# Patient Record
Sex: Male | Born: 1960 | Race: White | Hispanic: No | Marital: Single | Smoking: Never smoker
Health system: Southern US, Community
[De-identification: ages and names within clinical notes are randomized; demographics above are authoritative.]

## PROBLEM LIST (undated history)

## (undated) DIAGNOSIS — G4700 Insomnia, unspecified: Secondary | ICD-10-CM

## (undated) DIAGNOSIS — Z23 Encounter for immunization: Secondary | ICD-10-CM

## (undated) DIAGNOSIS — F32A Depression, unspecified: Secondary | ICD-10-CM

## (undated) DIAGNOSIS — I10 Essential (primary) hypertension: Secondary | ICD-10-CM

## (undated) DIAGNOSIS — K6289 Other specified diseases of anus and rectum: Secondary | ICD-10-CM

## (undated) DIAGNOSIS — F329 Major depressive disorder, single episode, unspecified: Secondary | ICD-10-CM

## (undated) DIAGNOSIS — K529 Noninfective gastroenteritis and colitis, unspecified: Secondary | ICD-10-CM

## (undated) DIAGNOSIS — F529 Unspecified sexual dysfunction not due to a substance or known physiological condition: Secondary | ICD-10-CM

## (undated) DIAGNOSIS — F332 Major depressive disorder, recurrent severe without psychotic features: Secondary | ICD-10-CM

## (undated) DIAGNOSIS — N62 Hypertrophy of breast: Secondary | ICD-10-CM

## (undated) DIAGNOSIS — M79604 Pain in right leg: Secondary | ICD-10-CM

## (undated) DIAGNOSIS — M79605 Pain in left leg: Principal | ICD-10-CM

## (undated) DIAGNOSIS — M25519 Pain in unspecified shoulder: Secondary | ICD-10-CM

## (undated) DIAGNOSIS — B20 Human immunodeficiency virus [HIV] disease: Secondary | ICD-10-CM

## (undated) DIAGNOSIS — R4586 Emotional lability: Secondary | ICD-10-CM

## (undated) HISTORY — DX: Major depressive disorder, recurrent severe without psychotic features: F33.2

## (undated) HISTORY — DX: Essential (primary) hypertension: I10

## (undated) HISTORY — DX: Depression, unspecified: F32.A

## (undated) HISTORY — DX: Other specified diseases of anus and rectum: K62.89

## (undated) HISTORY — DX: Pain in unspecified shoulder: M25.519

## (undated) HISTORY — DX: Pain in right leg: M79.604

## (undated) HISTORY — DX: Hypertrophy of breast: N62

## (undated) HISTORY — DX: Unspecified sexual dysfunction not due to a substance or known physiological condition: F52.9

## (undated) HISTORY — DX: Pain in left leg: M79.605

## (undated) HISTORY — DX: Noninfective gastroenteritis and colitis, unspecified: K52.9

## (undated) HISTORY — DX: Insomnia, unspecified: G47.00

## (undated) HISTORY — PX: TONSILLECTOMY: SUR1361

## (undated) HISTORY — PX: SKIN CANCER EXCISION: SHX779

## (undated) HISTORY — DX: Major depressive disorder, single episode, unspecified: F32.9

## (undated) HISTORY — DX: Emotional lability: R45.86

## (undated) HISTORY — DX: Encounter for immunization: Z23

---

## 2000-06-29 ENCOUNTER — Encounter: Admission: RE | Admit: 2000-06-29 | Discharge: 2000-09-27 | Payer: Self-pay | Admitting: Family Medicine

## 2006-01-08 ENCOUNTER — Emergency Department (HOSPITAL_COMMUNITY): Admission: EM | Admit: 2006-01-08 | Discharge: 2006-01-08 | Payer: Self-pay | Admitting: Family Medicine

## 2006-02-01 ENCOUNTER — Encounter (INDEPENDENT_AMBULATORY_CARE_PROVIDER_SITE_OTHER): Payer: Self-pay | Admitting: *Deleted

## 2006-02-01 ENCOUNTER — Encounter: Admission: RE | Admit: 2006-02-01 | Discharge: 2006-02-01 | Payer: Self-pay | Admitting: Internal Medicine

## 2006-02-01 ENCOUNTER — Ambulatory Visit: Payer: Self-pay | Admitting: Internal Medicine

## 2006-02-01 LAB — CONVERTED CEMR LAB: CD4 T Cell Abs: 550

## 2006-02-19 ENCOUNTER — Ambulatory Visit: Payer: Self-pay | Admitting: Internal Medicine

## 2006-02-21 ENCOUNTER — Emergency Department (HOSPITAL_COMMUNITY): Admission: EM | Admit: 2006-02-21 | Discharge: 2006-02-21 | Payer: Self-pay | Admitting: Family Medicine

## 2006-04-19 ENCOUNTER — Encounter (INDEPENDENT_AMBULATORY_CARE_PROVIDER_SITE_OTHER): Payer: Self-pay | Admitting: *Deleted

## 2006-04-19 LAB — CONVERTED CEMR LAB
CD4 Count: 0 microliters
HIV 1 RNA Quant: 0 copies/mL

## 2006-04-28 ENCOUNTER — Ambulatory Visit (HOSPITAL_COMMUNITY): Admission: RE | Admit: 2006-04-28 | Discharge: 2006-04-28 | Payer: Self-pay | Admitting: Urology

## 2006-05-02 ENCOUNTER — Encounter (INDEPENDENT_AMBULATORY_CARE_PROVIDER_SITE_OTHER): Payer: Self-pay | Admitting: *Deleted

## 2006-05-25 ENCOUNTER — Ambulatory Visit (HOSPITAL_BASED_OUTPATIENT_CLINIC_OR_DEPARTMENT_OTHER): Admission: RE | Admit: 2006-05-25 | Discharge: 2006-05-25 | Payer: Self-pay | Admitting: Urology

## 2007-05-19 IMAGING — CR DG ORBITS FOR FOREIGN BODY
2 series · 2 of 2 positions shown · non-contrast
Comparison: none

CLINICAL DATA: Pre MRI.  History of metal exposure to the eyes.  The patient is a welder.  
 ORBITS FOR FOREIGN BODY - 1 VIEW:

[view not recorded (1 of 2)]
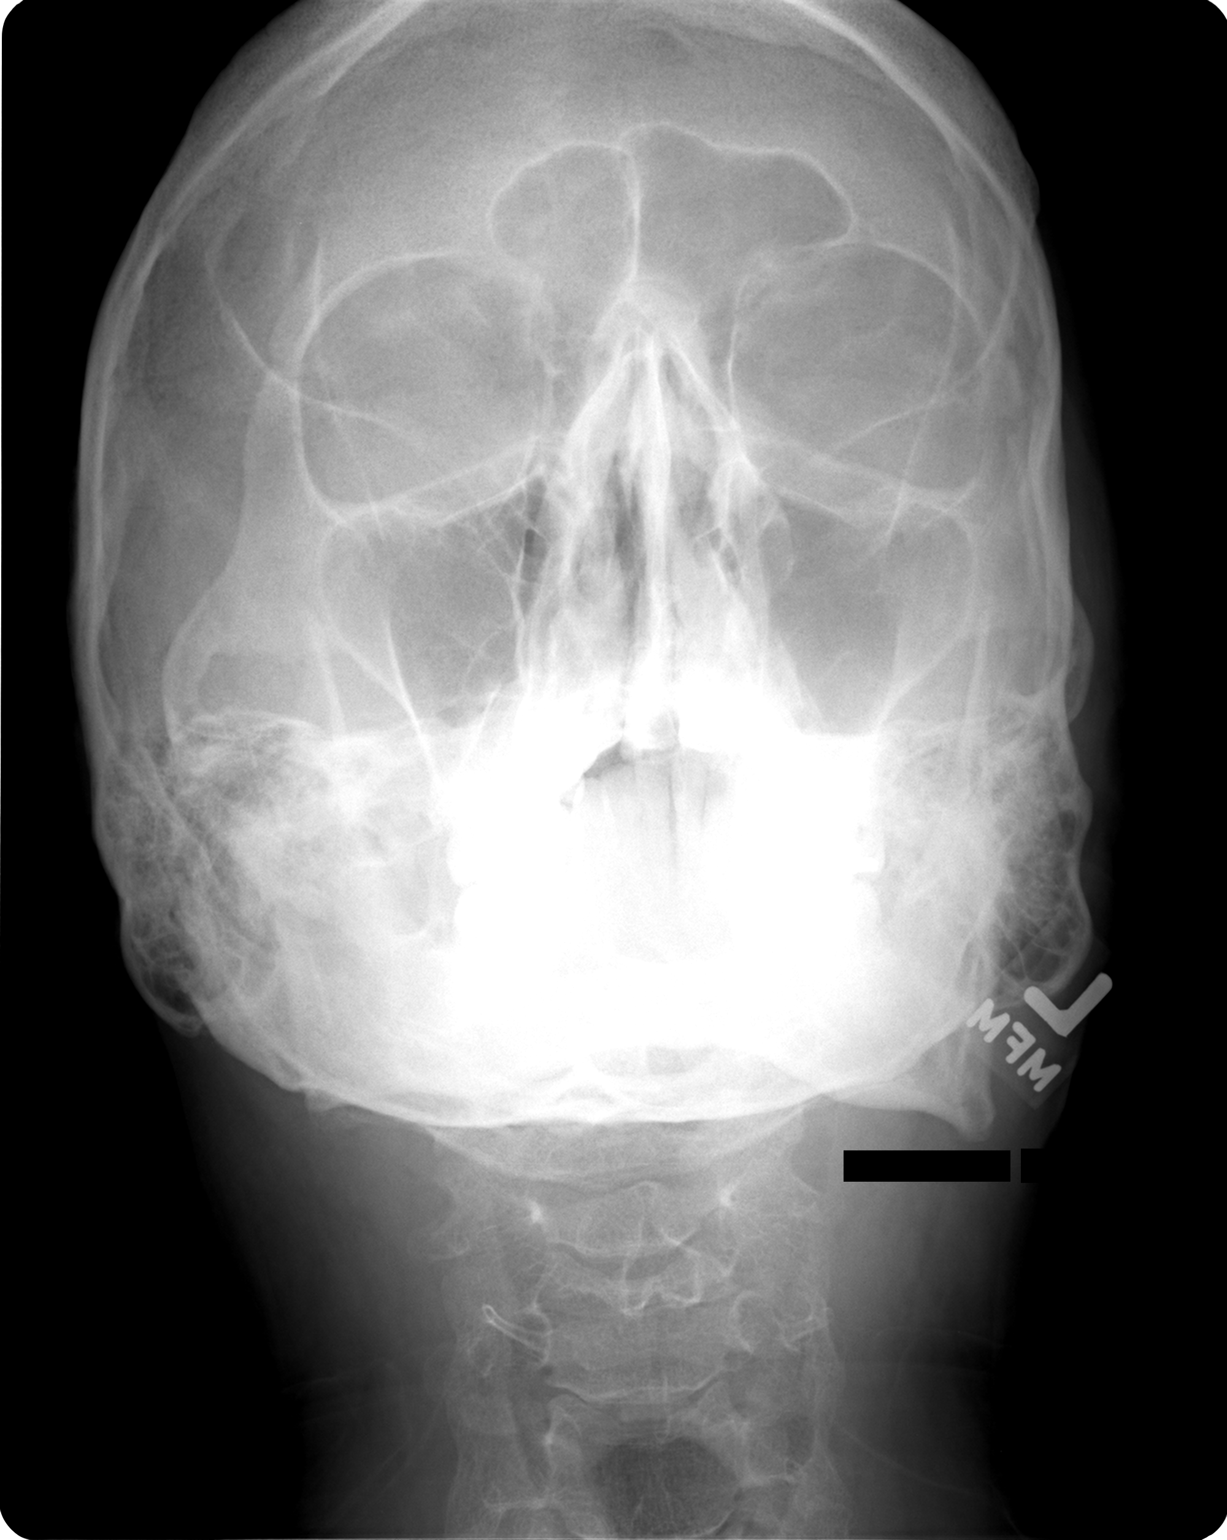

[view not recorded (2 of 2)]
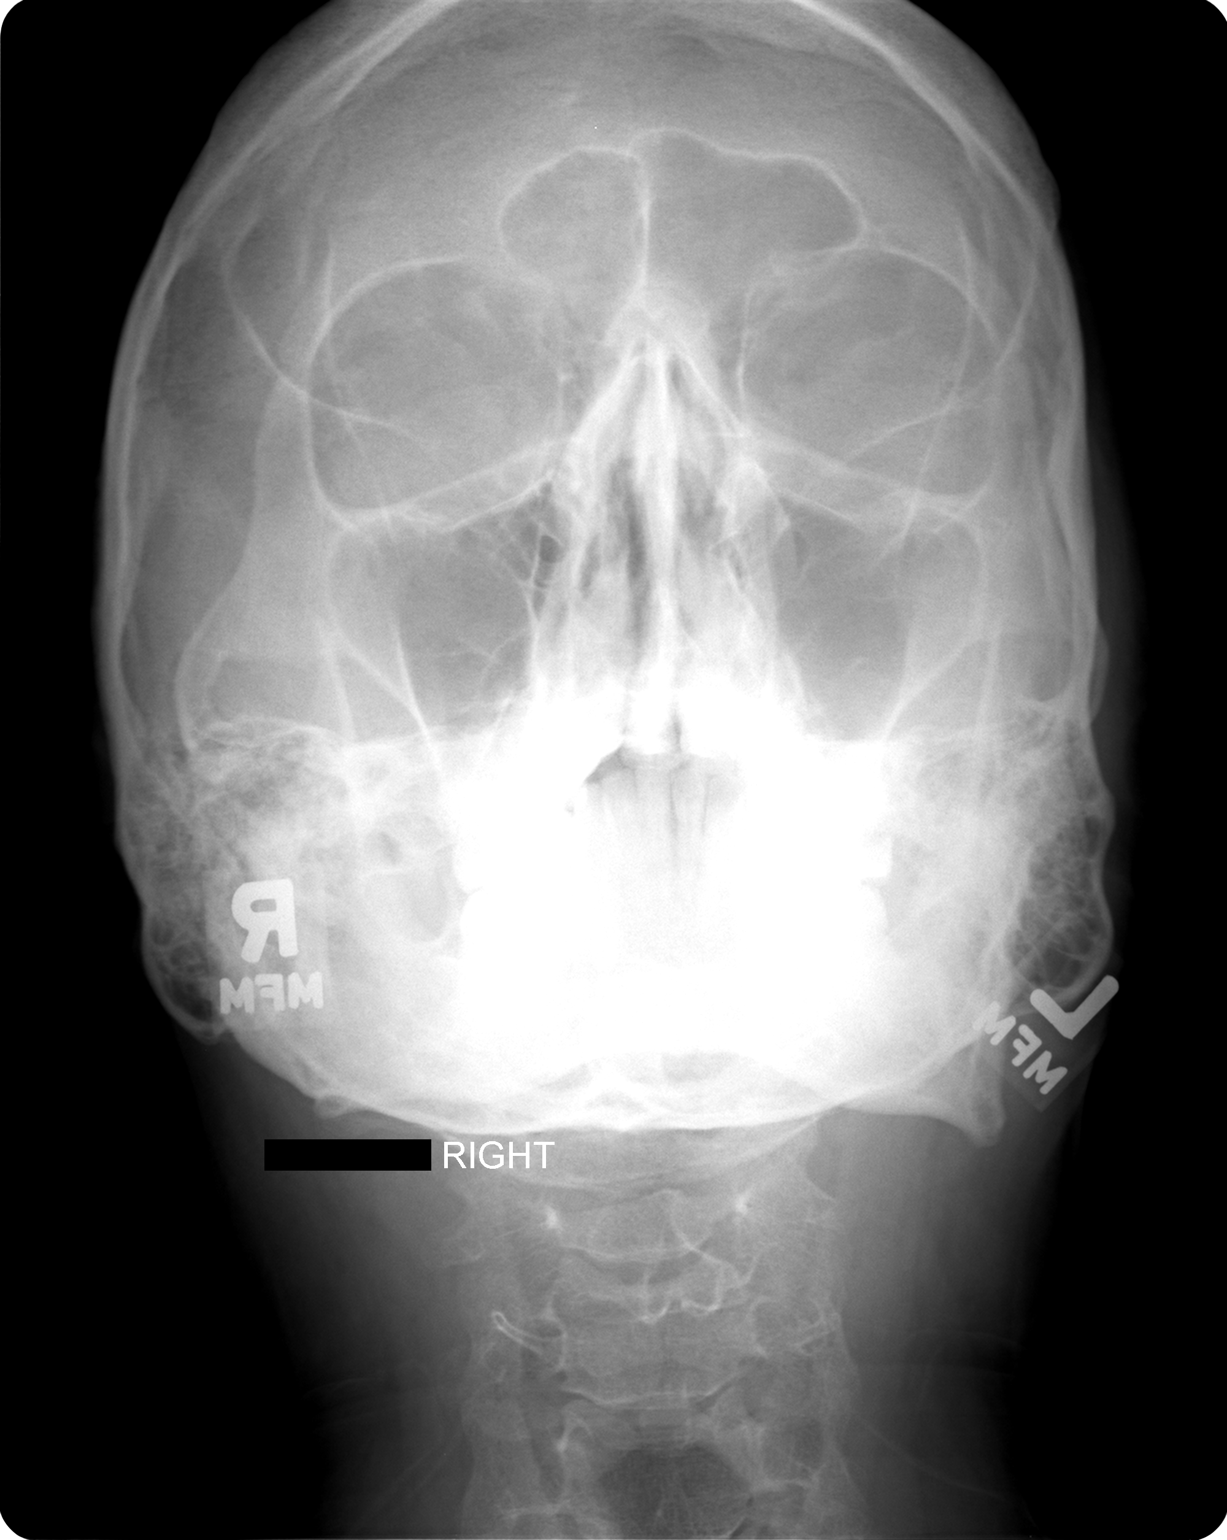

[2 of 2 positions shown; findings below may reference images not displayed]

FINDINGS: There is no evidence of radiodense foreign body in or around the orbits.  The bony structures appear normal.
IMPRESSION: Normal exam.

## 2007-05-23 ENCOUNTER — Ambulatory Visit: Payer: Self-pay | Admitting: Internal Medicine

## 2007-05-23 LAB — CONVERTED CEMR LAB
Albumin: 4.1 g/dL (ref 3.5–5.2)
Basophils Absolute: 0 10*3/uL (ref 0.0–0.1)
Basophils Relative: 0 % (ref 0–1)
CO2: 26 meq/L (ref 19–32)
Chloride: 98 meq/L (ref 96–112)
Eosinophils Absolute: 0.3 10*3/uL (ref 0.0–0.7)
Glucose, Bld: 93 mg/dL (ref 70–99)
HCV Ab: NEGATIVE
Lymphocytes Relative: 43 % (ref 12–46)
Lymphs Abs: 3.9 10*3/uL (ref 0.7–4.0)
MCHC: 33.5 g/dL (ref 30.0–36.0)
Monocytes Absolute: 0.9 10*3/uL (ref 0.1–1.0)
Monocytes Relative: 10 % (ref 3–12)
PSA: 1.05 ng/mL (ref 0.10–4.00)
Potassium: 4.5 meq/L (ref 3.5–5.3)
RBC: 4.82 M/uL (ref 4.22–5.81)
RDW: 13.9 % (ref 11.5–15.5)
Sodium: 136 meq/L (ref 135–145)

## 2007-05-27 ENCOUNTER — Ambulatory Visit: Payer: Self-pay | Admitting: *Deleted

## 2007-06-09 ENCOUNTER — Ambulatory Visit: Payer: Self-pay | Admitting: Internal Medicine

## 2007-06-10 ENCOUNTER — Encounter: Payer: Self-pay | Admitting: Internal Medicine

## 2007-06-10 LAB — CONVERTED CEMR LAB
Absolute CD4: 371 #/uL — ABNORMAL LOW (ref 381–1469)
HIV 1 RNA Quant: 20100 copies/mL — ABNORMAL HIGH (ref <50)
Total Lymphocytes %: 46 % (ref 12–46)

## 2007-06-30 ENCOUNTER — Ambulatory Visit: Payer: Self-pay | Admitting: Internal Medicine

## 2007-09-05 ENCOUNTER — Ambulatory Visit: Payer: Self-pay | Admitting: Internal Medicine

## 2007-09-05 LAB — CONVERTED CEMR LAB
ALT: 38 units/L (ref 0–53)
AST: 28 units/L (ref 0–37)
BUN: 10 mg/dL (ref 6–23)
Calcium: 8.8 mg/dL (ref 8.4–10.5)
Chloride: 103 meq/L (ref 96–112)
Creatinine, Ser: 1.1 mg/dL (ref 0.40–1.50)
Eosinophils Absolute: 0.1 10*3/uL (ref 0.0–0.7)
Eosinophils Relative: 1 % (ref 0–5)
HCT: 39.2 % (ref 39.0–52.0)
HIV-1 RNA Quant, Log: 4.62 — ABNORMAL HIGH (ref <1.70)
MCHC: 32.4 g/dL (ref 30.0–36.0)
MCV: 89.9 fL (ref 78.0–100.0)
Monocytes Absolute: 0.7 10*3/uL (ref 0.1–1.0)
Monocytes Relative: 14 % — ABNORMAL HIGH (ref 3–12)
Neutrophils Relative %: 50 % (ref 43–77)
Potassium: 4.1 meq/L (ref 3.5–5.3)
RDW: 14.8 % (ref 11.5–15.5)
RPR Ser Ql: REACTIVE — AB
RPR Titer: 1:64 {titer} — AB
Total Lymphocytes %: 35 % (ref 12–46)
Total Protein: 9.4 g/dL — ABNORMAL HIGH (ref 6.0–8.3)
WBC, lymph enumeration: 5 10*3/uL (ref 4.0–10.5)

## 2007-09-13 ENCOUNTER — Ambulatory Visit: Payer: Self-pay | Admitting: Internal Medicine

## 2007-09-19 ENCOUNTER — Ambulatory Visit: Payer: Self-pay | Admitting: Internal Medicine

## 2008-04-09 ENCOUNTER — Encounter: Payer: Self-pay | Admitting: Internal Medicine

## 2008-04-09 ENCOUNTER — Ambulatory Visit: Payer: Self-pay | Admitting: Internal Medicine

## 2008-04-09 DIAGNOSIS — B009 Herpesviral infection, unspecified: Secondary | ICD-10-CM | POA: Insufficient documentation

## 2008-04-09 DIAGNOSIS — Z8619 Personal history of other infectious and parasitic diseases: Secondary | ICD-10-CM | POA: Insufficient documentation

## 2008-04-09 DIAGNOSIS — B20 Human immunodeficiency virus [HIV] disease: Secondary | ICD-10-CM

## 2008-04-09 DIAGNOSIS — Z87442 Personal history of urinary calculi: Secondary | ICD-10-CM | POA: Insufficient documentation

## 2008-04-09 DIAGNOSIS — A539 Syphilis, unspecified: Secondary | ICD-10-CM

## 2008-04-09 LAB — CONVERTED CEMR LAB
AST: 12 units/L (ref 0–37)
Albumin: 4.3 g/dL (ref 3.5–5.2)
Alkaline Phosphatase: 114 units/L (ref 39–117)
BUN: 15 mg/dL (ref 6–23)
Basophils Absolute: 0 10*3/uL (ref 0.0–0.1)
CO2: 26 meq/L (ref 19–32)
Eosinophils Relative: 4 % (ref 0–5)
HIV 1 RNA Quant: <48 copies/mL (ref <48)
MCHC: 33.1 g/dL (ref 30.0–36.0)
MCV: 94.7 fL (ref 78.0–100.0)
Monocytes Relative: 7 % (ref 3–12)
Neutro Abs: 2.1 10*3/uL (ref 1.7–7.7)
Neutrophils Relative %: 36 % — ABNORMAL LOW (ref 43–77)
RBC: 4.56 M/uL (ref 4.22–5.81)
Total Bilirubin: 0.3 mg/dL (ref 0.3–1.2)
WBC: 5.9 10*3/uL (ref 4.0–10.5)

## 2008-04-24 ENCOUNTER — Telehealth: Payer: Self-pay | Admitting: Internal Medicine

## 2008-04-26 ENCOUNTER — Ambulatory Visit: Payer: Self-pay | Admitting: Internal Medicine

## 2008-04-26 ENCOUNTER — Encounter: Payer: Self-pay | Admitting: Internal Medicine

## 2008-04-26 DIAGNOSIS — R109 Unspecified abdominal pain: Secondary | ICD-10-CM | POA: Insufficient documentation

## 2008-04-26 DIAGNOSIS — G4700 Insomnia, unspecified: Secondary | ICD-10-CM | POA: Insufficient documentation

## 2008-04-26 LAB — CONVERTED CEMR LAB
Absolute CD4: 495 #/uL (ref 381–1469)
CD4 Count: 495 microliters
Total Lymphocytes %: 33 % (ref 12–46)

## 2008-04-27 ENCOUNTER — Encounter: Payer: Self-pay | Admitting: Internal Medicine

## 2008-05-10 ENCOUNTER — Ambulatory Visit: Payer: Self-pay | Admitting: Internal Medicine

## 2008-05-22 ENCOUNTER — Telehealth: Payer: Self-pay | Admitting: Internal Medicine

## 2008-06-28 ENCOUNTER — Encounter: Payer: Self-pay | Admitting: Internal Medicine

## 2008-08-23 ENCOUNTER — Encounter: Payer: Self-pay | Admitting: Internal Medicine

## 2008-08-23 ENCOUNTER — Ambulatory Visit: Payer: Self-pay | Admitting: Family Medicine

## 2008-08-23 DIAGNOSIS — J209 Acute bronchitis, unspecified: Secondary | ICD-10-CM | POA: Insufficient documentation

## 2008-08-23 DIAGNOSIS — M2550 Pain in unspecified joint: Secondary | ICD-10-CM | POA: Insufficient documentation

## 2008-08-23 DIAGNOSIS — R197 Diarrhea, unspecified: Secondary | ICD-10-CM | POA: Insufficient documentation

## 2008-09-13 ENCOUNTER — Ambulatory Visit: Payer: Self-pay | Admitting: Internal Medicine

## 2008-09-13 ENCOUNTER — Encounter: Payer: Self-pay | Admitting: Internal Medicine

## 2008-09-13 DIAGNOSIS — G44009 Cluster headache syndrome, unspecified, not intractable: Secondary | ICD-10-CM | POA: Insufficient documentation

## 2008-09-13 DIAGNOSIS — K029 Dental caries, unspecified: Secondary | ICD-10-CM | POA: Insufficient documentation

## 2008-11-14 ENCOUNTER — Encounter: Payer: Self-pay | Admitting: Internal Medicine

## 2008-12-13 ENCOUNTER — Telehealth: Payer: Self-pay | Admitting: Internal Medicine

## 2009-05-02 ENCOUNTER — Ambulatory Visit: Payer: Self-pay | Admitting: Internal Medicine

## 2009-05-02 ENCOUNTER — Encounter: Payer: Self-pay | Admitting: Internal Medicine

## 2009-05-02 DIAGNOSIS — R824 Acetonuria: Secondary | ICD-10-CM | POA: Insufficient documentation

## 2009-05-02 LAB — CONVERTED CEMR LAB
Bilirubin Urine: NEGATIVE
Blood in Urine, dipstick: NEGATIVE
Glucose, Urine, Semiquant: NEGATIVE
Ketones, urine, test strip: NEGATIVE
Nitrite: NEGATIVE
Protein, U semiquant: NEGATIVE
Specific Gravity, Urine: 1.025
WBC Urine, dipstick: NEGATIVE
pH: 6.5

## 2009-05-06 LAB — CONVERTED CEMR LAB
Absolute CD4: 780 #/uL (ref 381–1469)
Alkaline Phosphatase: 115 units/L (ref 39–117)
Basophils Relative: 1 % (ref 0–1)
CO2: 24 meq/L (ref 19–32)
Calcium: 9.5 mg/dL (ref 8.4–10.5)
Chloride: 101 meq/L (ref 96–112)
Creatinine, Ser: 1.28 mg/dL (ref 0.40–1.50)
Glucose, Bld: 102 mg/dL — ABNORMAL HIGH (ref 70–99)
HIV 1 RNA Quant: 1930 copies/mL — ABNORMAL HIGH (ref <48)
Lymphs Abs: 3.1 10*3/uL (ref 0.7–4.0)
MCHC: 32.5 g/dL (ref 30.0–36.0)
Monocytes Relative: 8 % (ref 3–12)
Neutro Abs: 4 10*3/uL (ref 1.7–7.7)
Platelets: 366 10*3/uL (ref 150–400)
Potassium: 4.4 meq/L (ref 3.5–5.3)
RDW: 13.7 % (ref 11.5–15.5)
Total Bilirubin: 0.5 mg/dL (ref 0.3–1.2)
Total CHOL/HDL Ratio: 5.4
Total Protein: 8.4 g/dL — ABNORMAL HIGH (ref 6.0–8.3)
Total lymphocyte count: 3120 cells/mcL (ref 700–3300)

## 2009-05-07 ENCOUNTER — Telehealth: Payer: Self-pay | Admitting: Internal Medicine

## 2009-05-07 ENCOUNTER — Encounter: Payer: Self-pay | Admitting: Internal Medicine

## 2009-05-14 ENCOUNTER — Telehealth: Payer: Self-pay | Admitting: Internal Medicine

## 2009-05-21 ENCOUNTER — Telehealth: Payer: Self-pay | Admitting: Internal Medicine

## 2009-11-26 ENCOUNTER — Ambulatory Visit: Payer: Self-pay | Admitting: Internal Medicine

## 2009-11-26 LAB — CONVERTED CEMR LAB
AST: 17 units/L (ref 0–37)
Albumin: 4.3 g/dL (ref 3.5–5.2)
Basophils Absolute: 0 10*3/uL (ref 0.0–0.1)
Calcium: 9.2 mg/dL (ref 8.4–10.5)
Eosinophils Absolute: 0.4 10*3/uL (ref 0.0–0.7)
Lymphocytes Relative: 40 % (ref 12–46)
Lymphs Abs: 4 10*3/uL (ref 0.7–4.0)
MCV: 94.4 fL (ref 78.0–100.0)
Monocytes Relative: 7 % (ref 3–12)
Neutro Abs: 4.9 10*3/uL (ref 1.7–7.7)
Neutrophils Relative %: 49 % (ref 43–77)
Potassium: 3.9 meq/L (ref 3.5–5.3)
RDW: 13.3 % (ref 11.5–15.5)
Total Bilirubin: 0.3 mg/dL (ref 0.3–1.2)
Total Protein: 7.6 g/dL (ref 6.0–8.3)

## 2009-12-20 ENCOUNTER — Ambulatory Visit: Payer: Self-pay | Admitting: Internal Medicine

## 2009-12-20 DIAGNOSIS — R21 Rash and other nonspecific skin eruption: Secondary | ICD-10-CM | POA: Insufficient documentation

## 2009-12-20 DIAGNOSIS — H6120 Impacted cerumen, unspecified ear: Secondary | ICD-10-CM | POA: Insufficient documentation

## 2009-12-20 LAB — CONVERTED CEMR LAB: GC Probe Amp, Urine: NEGATIVE

## 2009-12-26 ENCOUNTER — Telehealth (INDEPENDENT_AMBULATORY_CARE_PROVIDER_SITE_OTHER): Payer: Self-pay | Admitting: *Deleted

## 2010-01-20 ENCOUNTER — Encounter (INDEPENDENT_AMBULATORY_CARE_PROVIDER_SITE_OTHER): Payer: Self-pay | Admitting: *Deleted

## 2010-01-21 ENCOUNTER — Telehealth (INDEPENDENT_AMBULATORY_CARE_PROVIDER_SITE_OTHER): Payer: Self-pay | Admitting: *Deleted

## 2010-03-16 ENCOUNTER — Encounter: Payer: Self-pay | Admitting: Internal Medicine

## 2010-03-25 NOTE — Progress Notes (Signed)
Summary: Derm. referral  Phone Note Call from Patient   Caller: Patient Reason for Call: Acute Illness Summary of Call: pt. called wanting to be referred to dermatologist for skin rash and wanted to know the cost.  Spoke with [facility]. and because pt. does not have insurance he can schedule the appt. himself and the cost is 125.00.  Pt. notified and given phone number to call [phone] Initial call taken by: Wendall Mola CMA Duncan Dull),  December 26, 2009 4:25 PM     Appended Document: Derm. referral correction [facility] [phone]

## 2010-03-25 NOTE — Assessment & Plan Note (Signed)
Summary: f/u ov/vs   CC:  follow-up visit, lab results, c/o herpes breakout very painful on buttocks, and discomfort left ear.  History of Present Illness: Pt c/o itchy painful rash on his buttoc.  It started a few days ago.  He has had some relationship issues and is on a lot of stress. He also c/o a feeling of something moving in his left ear-no pain.He has been taking his meds every day.  Preventive Screening-Counseling & Management  Alcohol-Tobacco     Alcohol drinks/day: <1     Alcohol type: occ. once a month     Smoking Status: never  Caffeine-Diet-Exercise     Caffeine use/day: tea and soda 2 per day     Does Patient Exercise: yes     Type of exercise: walking     Times/week: 3  Hep-HIV-STD-Contraception     HIV Risk: risk noted     Sun Exposure-Excessive: rarely  Safety-Violence-Falls     Seat Belt Use: 100      Sexual History:  none.        Drug Use:  never and no.    Comments: pt. given condoms   Updated Prior Medication List: ATRIPLA 600-200-300 MG TABS (EFAVIRENZ-EMTRICITAB-TENOFOVIR) one daily IMODIUM A-D 2 MG TABS (LOPERAMIDE HCL) Take 1 tablet by mouth two times a day VALTREX 500 MG TABS (VALACYCLOVIR HCL) Take 1 tablet by mouth two times a day LOTRISONE 1-0.05 % CREA (CLOTRIMAZOLE-BETAMETHASONE) apply two times a day  Current Allergies (reviewed today): No known allergies  Past History:  Past Medical History: Last updated: 04/09/2008 HIV disease  Social History: Drug Use:  never, no Sexual History:  none  Review of Systems  The patient denies anorexia, fever, and weight loss.    Vital Signs:  Patient profile:   50 year old male Height:      72 inches (182.88 cm) Weight:      199.4 pounds (90.64 kg) BMI:     27.14 Temp:     98.4 degrees F (36.89 degrees C) oral Pulse rate:   81 / minute BP sitting:   148 / 97  (right arm)  Vitals Entered By: Wendall Mola CMA Duncan Dull) (December 20, 2009 9:07 AM) CC: follow-up visit, lab results,  c/o herpes breakout very painful on buttocks, discomfort left ear Is Patient Diabetic? No Pain Assessment Patient in pain? yes     Location: buttocks Intensity: 9 Type: burning Onset of pain  Constant Nutritional Status BMI of 25 - 29 = overweight Nutritional Status Detail appetite "so-so"  Have you ever been in a relationship where you felt threatened, hurt or afraid?No   Does patient need assistance? Functional Status Self care Ambulation Normal Comments no missed doses of meds per pt.   Physical Exam  General:  alert, well-developed, well-nourished, and well-hydrated.   Head:  normocephalic and atraumatic.   Ears:  crumen in left ear Rectal:  pt has an erythematous patchy rash with some scale on buttock area. No ulceration   Impression & Recommendations:  Problem # 1:  HIV INFECTION (ICD-042) Pt.s most recent CD4ct was 850 and VL <20 .  Pt instructed to continue the current antiretroviral regimen.  Pt encouraged to take medication regularly and not miss doses.  Pt will f/u in 3 months for repeat blood work and will see me 2 weeks later. Influenza vaccine given.  Diagnostics Reviewed:  HIV: HIV positive - not AIDS (04/09/2008)   CD4: 850 (11/28/2009)   WBC: 9.9 (11/26/2009)  Hgb: 14.7 (11/26/2009)   HCT: 42.2 (11/26/2009)   Platelets: 346 (11/26/2009) HIV genotype: See Comment (05/07/2009)   HIV-1 RNA: <20 copies/mL (11/26/2009)   HBSAg: NEG (05/23/2007)  Orders: Est. Patient Level III (99213)Future Orders: T-CD4SP ([facility]) (CD4SP) ... 06/18/2010 T-HIV Viral Load [phone]) ... 06/18/2010 T-Comprehensive Metabolic Panel [phone]) ... 06/18/2010 T-CBC w/Diff ([id number]) ... 06/18/2010  Problem # 2:  SKIN RASH (ICD-782.1) if not better refer to Derm His updated medication list for this problem includes:    Lotrisone 1-0.05 % Crea (Clotrimazole-betamethasone) .Marland Kitchen... Apply two times a day  Problem # 3:  CERUMEN IMPACTION, LEFT (ICD-380.4) trila of  debrox  Medications Added to Medication List This Visit: 1)  Lotrisone 1-0.05 % Crea (Clotrimazole-betamethasone) .... Apply two times a day  Other Orders: T-Testosterone; Total [phone]) T-Chlamydia  Probe, urine [phone]) T-GC Probe, urine [phone]) Influenza Vaccine NON MCR (03474)  Patient Instructions: 1)  Please schedule a follow-up appointment in 6 months, 2 weeks after labs.  Prescriptions: LOTRISONE 1-0.05 % CREA (CLOTRIMAZOLE-BETAMETHASONE) apply two times a day  #60gm x 0   Entered and Authorized by:   Yisroel Ramming MD   Signed by:   Yisroel Ramming MD on 12/20/2009   Method used:   Print then Give to Patient   RxID:   [id number]      Immunizations Administered:  Influenza Vaccine # 1:    Vaccine Type: Fluvax Non-MCR    Site: right deltoid    Mfr: Novartis    Dose: 0.5 ml    Route: IM    Given by: Wendall Mola CMA ( AAMA)    Exp. Date: 05/25/2010    Lot #: [id number]    VIS given: 09/17/09 version given December 20, 2009.  Flu Vaccine Consent Questions:    Do you have a history of severe allergic reactions to this vaccine? no    Any prior history of allergic reactions to egg and/or gelatin? no    Do you have a sensitivity to the preservative Thimersol? no    Do you have a past history of Guillan-Barre Syndrome? no    Do you currently have an acute febrile illness? no    Have you ever had a severe reaction to latex? no    Vaccine information given and explained to patient? yes

## 2010-03-25 NOTE — Miscellaneous (Signed)
Summary: HIPAA Restrictions  HIPAA Restrictions   Imported By: Florinda Marker 11/28/2009 08:39:59  _____________________________________________________________________  External Attachment:    Type:   Image     Comment:   External Document

## 2010-03-25 NOTE — Progress Notes (Signed)
Summary: Valtrex PAP arrived, pt. informed  Phone Note Outgoing Call   Call placed by: Jennet Maduro RN,  January 21, 2010 4:10 PM Call placed to: Patient Action Taken: Assistance medications ready for pick up Summary of Call: 3 months of Valtrex arrived from PAP.  Message left for pt. to pick-up PAP.  Jennet Maduro RN  January 21, 2010 4:12 PM     Prescriptions: VALTREX 500 MG TABS (VALACYCLOVIR HCL) Take 1 tablet by mouth two times a day  #180 tablets x 0   Entered by:   Jennet Maduro RN   Authorized by:   Yisroel Ramming MD   Signed by:   Jennet Maduro RN on 01/21/2010   Method used:   Samples Given   RxID:   [id number]  Lot # [id number] Exp. date 06/2012 Jennet Maduro RN  January 21, 2010 4:13 PM  Appended Document: Valtrex PAP arrived, pt. informed pt. picked up pt. assistance meds

## 2010-03-25 NOTE — Progress Notes (Signed)
Summary: lab added  Phone Note Outgoing Call   Call placed by: Sharen Heck RN,  May 07, 2009 12:19 PM Call placed to: Patient Summary of Call: [facility] called and genotype added. Initial call taken by: Sharen Heck RN,  May 07, 2009 12:20 PM

## 2010-03-25 NOTE — Miscellaneous (Signed)
Summary: Orders Update  Clinical Lists Changes  Orders: Added new Test order of T-CBC w/Diff [phone]) - Signed Added new Test order of T-CD4SP [facility]) (CD4SP) - Signed Added new Test order of T-Comprehensive Metabolic Panel [phone]) - Signed Added new Test order of T-HIV Viral Load [phone]) - Signed     Process Orders Check Orders Results:     [facility]: ABN not required for this insurance Tests Sent for requisitioning (November 26, 2009 2:28 PM):     11/26/2009: [facility] -- T-CBC w/Diff [[id number]] (signed)     11/26/2009: [facility] -- T-Comprehensive Metabolic Panel [[id number]] (signed)     11/26/2009: [facility] -- T-HIV Viral Load [phone] (signed)

## 2010-03-25 NOTE — Miscellaneous (Signed)
Summary: RW FINANCIAL UPDATE  Clinical Lists Changes  Observations: Added new observation of RWTITLE: B (01/20/2010 9:07) Added new observation of PAYOR: No Insurance (01/20/2010 9:07) Added new observation of AIDSDAP: Yes 2011-[place] (01/20/2010 9:07) Added new observation of PCTFPL: 271.47  (01/20/2010 9:07) Added new observation of INCOMESOURCE: WAGES  (01/20/2010 9:07) Added new observation of HOUSEINCOME: 16109  (01/20/2010 9:07) Added new observation of HOUSING: Stable/permanent  (01/20/2010 9:07) Added new observation of FINASSESSDT: 12/23/2009  (01/20/2010 9:07)

## 2010-03-25 NOTE — Progress Notes (Signed)
Summary: lab results  Phone Note Outgoing Call   Call placed by: Sharen Heck RN,  May 14, 2009 12:36 PM Call placed to: Patient Action Taken: Phone Call Completed Summary of Call: ML on VM lab results negative and to call with any questions. Initial call taken by: Sharen Heck RN,  May 14, 2009 12:36 PM

## 2010-03-25 NOTE — Miscellaneous (Signed)
Summary: Office Visit ([facility])    Vital Signs:  Patient profile:   50 year old male Weight:      195.9 pounds Temp:     98.0 degrees F oral Pulse rate:   76 / minute Pulse rhythm:   regular Resp:     18 per minute BP sitting:   116 / 79  (left arm)  Vitals Entered By: Sharen Heck RN (May 02, 2009 8:53 AM) CC: f/u 05, burning after urination, wants to be checked for STD's, needs Valtex refill Is Patient Diabetic? No Pain Assessment Patient in pain? no       Does patient need assistance? Functional Status Self care Ambulation Normal   CC:  f/u 05, burning after urination, wants to be checked for STD's, and needs Valtex refill.  History of Present Illness: Pt c/o burning with urination for about a month.  No urethral discharge or frequency. He states that he has been taking his Atripla every day.  Preventive Screening-Counseling & Management  Alcohol-Tobacco     Alcohol drinks/day: <1     Alcohol type: occ. 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 One Daily 2)  Trazodone Hcl 50 Mg Tabs (Trazodone Hcl) .Marland Kitchen.. 1 Tablet At Bedtime 3)  Tramadol Hcl 50 Mg Tabs (Tramadol Hcl) .Marland Kitchen.. 1 Tablet Every 8 Hours As Needed. 4)  Imodium A-D 2 Mg Tabs (Loperamide Hcl) .... Take 1 Tablet By Mouth Two Times A Day 5)   Fluconazole 100 Mg Tabs (Fluconazole) .... Take 1 Tablet By Mouth Once A Day 6)  Valtrex 500 Mg Tabs (Valacyclovir Hcl) .... Take 1 Tablet By Mouth Two Times A Day 7)  Protonix 40 Mg Tbec (Pantoprazole Sodium) .... Take 1 Tablet By Mouth Once A Day 8)  Verelan 120 Mg Xr24h-Cap (Verapamil Hcl) .... Take 1 Tablet By Mouth Once A Day  Allergies (verified): No Known Drug Allergies   Review of Systems  The patient denies anorexia, fever, weight loss, and abdominal pain.     Physical Exam  General:  alert, well-developed, well-nourished, and well-hydrated.   Head:  normocephalic and atraumatic.   Mouth:  pharynx pink and moist.   Lungs:  normal breath sounds.      Impression & Recommendations:  Problem # 1:  HIV DISEASE (ICD-042) Will check labs today.  Pt to continue his Atripla. Orders: Est. Patient Level III (95621) T-CBC w/Diff [phone]) T-CD4SP [facility]) (CD4SP) T-Chlamydia  Probe, urine [phone]) T-GC Probe, urine [phone]) T-Comprehensive Metabolic Panel [phone]) T-HIV Viral Load [id number]) T-Urinalysis Dipstick only (33295JO)  Diagnostics Reviewed:  HIV: HIV positive - not AIDS (04/09/2008)   CD4: 495 (04/26/2008)   WBC: 5.9 (04/09/2008)   Hgb: 14.3 (04/09/2008)   HCT: 43.2 (04/09/2008)   Platelets: 331 (04/09/2008) HIV-1 RNA: <48 copies/mL (04/09/2008)  HBSAg: NEG (05/23/2007)  Other Orders: T-RPR (Syphilis) [phone]) T-Lipid Profile ([id number])    Patient Instructions: 1)  Please schedule a follow-up appointment in 3 months. ] Laboratory Results   Urine Tests  Date/Time Received: 05/02/09 0923  Routine Urinalysis   Color: lt. yellow Appearance: Clear Glucose: negative   (Normal Range: Negative) Bilirubin: negative   (Normal Range: Negative) Ketone: negative   (Normal Range: Negative) Spec. Gravity: 1.025   (Normal Range: 1.003-1.035) Blood: negative   (Normal Range: Negative) pH: 6.5   (Normal Range: 5.0-8.0) Protein:  negative   (Normal Range: Negative) Urobilinogen: 0.2   (Normal Range: 0-1) Nitrite: negative   (Normal Range: Negative) Leukocyte Esterace: negative   (Normal Range: Negative)

## 2010-03-25 NOTE — Miscellaneous (Signed)
Summary: Orders Update  Clinical Lists Changes  Orders: Added new Test order of T-RPR (Syphilis) [phone]) - Signed     Process Orders Check Orders Results:     [facility]: ABN not required for this insurance Tests Sent for requisitioning (November 26, 2009 4:23 PM):     11/26/2009: [facility] -- T-RPR (Syphilis) [phone] (signed)

## 2010-03-25 NOTE — Progress Notes (Signed)
Summary: Valtrex refill  Phone Note Refill Request Message from:  Pharmacy on 05/17/09  Refills Requested: Medication #1:  VALTREX 500 MG TABS Take 1 tablet by mouth two times a day   Dosage confirmed as above?Dosage Confirmed   Brand Name Necessary? No   Supply Requested: 1 month   Last Refilled: 04/11/2009  Method Requested: Telephone to Pharmacy Next Appointment Scheduled: seen March 2011 Initial call taken by: Sharen Heck RN,  May 21, 2009 4:31 PM    Prescriptions: VALTREX 500 MG TABS (VALACYCLOVIR HCL) Take 1 tablet by mouth two times a day  #60 x 3   Entered by:   Sharen Heck RN   Authorized by:   Yisroel Ramming MD   Signed by:   Yisroel Ramming MD on 05/23/2009   Method used:   Print then Give to Patient   RxID:   [id number]

## 2010-05-08 LAB — T-HELPER CELL (CD4) - (RCID CLINIC ONLY): CD4 T Cell Abs: 850 uL (ref 400–2700)

## 2010-05-21 ENCOUNTER — Telehealth: Payer: Self-pay | Admitting: Adult Health

## 2010-05-21 ENCOUNTER — Other Ambulatory Visit: Payer: Self-pay | Admitting: Licensed Clinical Social Worker

## 2010-05-21 DIAGNOSIS — B009 Herpesviral infection, unspecified: Secondary | ICD-10-CM

## 2010-05-21 MED ORDER — VALACYCLOVIR HCL 500 MG PO TABS: 500.0000 mg | ORAL_TABLET | Freq: Two times a day (BID) | ORAL | Status: DC

## 2010-05-21 NOTE — Telephone Encounter (Signed)
I received a message that the patient needed a refill of Valtrex which he receives through [employer].  I called [employer] and ordered his last refill and started the re-enrollment process.  I left a message for the patient about scheduling a time for him to come in and sign the new app and that I would need his proof of income.  Waiting for a call back from the patient.Marland KitchenMarland KitchenAntionette Fairy

## 2010-05-23 ENCOUNTER — Telehealth: Payer: Self-pay | Admitting: Adult Health

## 2010-05-23 NOTE — Telephone Encounter (Signed)
The application for [employer] for Valtrex was mailed to the patient at his request to sign and return.[name]

## 2010-06-02 ENCOUNTER — Telehealth: Payer: Self-pay

## 2010-06-02 NOTE — Telephone Encounter (Signed)
Left message for patient regarding denial of ADAP and if the patient would like to seek Prescription Assistance through other companies.  Waiting for call back..Karie Russell ° °

## 2010-06-06 ENCOUNTER — Telehealth: Payer: Self-pay | Admitting: Adult Health

## 2010-06-06 NOTE — Telephone Encounter (Signed)
Applications to [employer] to Access for Valtrex and Atripla Patient Assistance for Atripla were mailed today.[name]

## 2010-06-10 ENCOUNTER — Telehealth: Payer: Self-pay | Admitting: *Deleted

## 2010-06-10 NOTE — Telephone Encounter (Signed)
rec'd a faxed letter from the pap plan for atripla . They needed to confirm the diagnosis was 042, not 042.0. I told them the 0 was written in error, should be plain 042.  Also wants him to re-apply for ADAP. She will approve the Atripla for 60 days & will call us then. To Benny Lennert to see about re-applying for ADAP. He was denied due to income.

## 2010-06-11 ENCOUNTER — Telehealth: Payer: Self-pay | Admitting: Adult Health

## 2010-06-11 NOTE — Telephone Encounter (Signed)
Called to check on status of application for Atripla. I received a note from Golden Circle, RN regarding ICD9 Code that was on application at 042.0 needed to be listed as 042 and was apparently listed incorrectly when she loaded the application.  The ICD9 Code has been corrected, but the denial letter from ADAP was needed.  When I faxed and mailed the application, the denial letter for ADAP was attached and the Prescription Assistance has it, but it does not say "ADAP" anywhere on it so they did not know that is what is was.  The application will be forwarded for determination now that they know they have the denial letter and if anything else is needed, they will contact the office.  Jonathan Hendricks is at the moment temporarily enrolled, Member ID# [id number], Group # is [id number], [name] # is [phone].  Jonathan Hendricks was called and a message was left for him to call back.[name]

## 2010-06-12 ENCOUNTER — Telehealth: Payer: Self-pay | Admitting: *Deleted

## 2010-06-12 NOTE — Telephone Encounter (Signed)
PAP for Atripla faxed information stating that the patient was approved for coverage. In the letter it also states that the patient will be sent a card that once he has it he will need a Rx to take to the pharmacy an can get it for free. He should have the card in about 2 weeks. The contact information for program is [phone] Mon-Fri 9am-8pm with service request # [id number]

## 2010-06-23 ENCOUNTER — Telehealth: Payer: Self-pay | Admitting: Adult Health

## 2010-06-23 NOTE — Telephone Encounter (Signed)
Mr. Coberly and I spoke about other options for Valtrex.  I told him I would check with a provider to see if he can take Acyclovir instead because it is on the $4 [employer] plan.  Dr. Maurice March said that it would be ok, but this patient has not been seen in several months, last time was with Dr. Philipp Deputy.  He was informed that Acyclovir is an option for him, but that he will need to make an appointment in order to get that prescription.[name]

## 2010-06-26 ENCOUNTER — Other Ambulatory Visit: Payer: Self-pay | Admitting: *Deleted

## 2010-06-26 DIAGNOSIS — B20 Human immunodeficiency virus [HIV] disease: Secondary | ICD-10-CM

## 2010-06-26 MED ORDER — EFAVIRENZ-EMTRICITAB-TENOFOVIR 600-200-300 MG PO TABS: 1.0000 | ORAL_TABLET | Freq: Every day | ORAL | Status: DC

## 2010-07-11 NOTE — Op Note (Signed)
NAME:  Jonathan Hendricks, Jonathan Hendricks                ACCOUNT NO.:  [id number]   MEDICAL RECORD NO.:  [id number]          PATIENT TYPE:  AMB   LOCATION:  [facility]                         [facility]   PHYSICIAN:  Jonathan Hendricks, Jonathan HendricksDATE OF BIRTH:  1960/12/26   DATE OF PROCEDURE:  DATE OF DISCHARGE:                               OPERATIVE REPORT   PREOPERATIVE DIAGNOSIS:  Right distal ureteral stone.   POSTOPERATIVE DIAGNOSIS:  Right distal ureteral stone.   PROCEDURE PERFORMED:  1. Cystoscopy.  2. Right ureteroscopic stone manipulation.   SURGEON:  Jonathan Hendricks, M.D.   RESIDENT:  Jonathan Hendricks, M.D.   ANESTHESIA:  General.   COMPLICATIONS:  None.   DRAINS:  None.   SPECIMENS:  Stone for analysis.   DISPOSITION:  Stable to postanesthesia care unit.   INDICATIONS FOR PROCEDURE:  Jonathan Hendricks is a 50 year old male with a  history of right flank pain.  He was seen in the emergency room in  December 2007 and was found to have a distal 6 mm right ureteral stone.  This has not passed.  He presents today for ureteroscopic stone  manipulation.  Benefits and risks of the procedure were explained, and  consent was obtained.   DESCRIPTION OF PROCEDURE:  The patient was brought to the operating room  and was properly identified.  Time-out was performed to confirm the  correct patient, procedure, and side.  He was administered general  anesthesia, given preoperative antibiotics, placed in the dorsal  lithotomy position, and prepped and draped in sterile fashion.  The  stone was visible on scout imaging.  Cystoscopy was performed using a 22-  French sheath.  His anterior and posterior urethra were normal.  Systematic examination of the bladder revealed no mucosal abnormalities  or other lesions.  There was no evidence of foreign body or stone in the  bladder.  Both ureteral orifices were in their normal anatomic position  and effluxing clear urine.  We next placed a safety wire through  the  right ureteral orifice up to the level of the kidney.  The cystoscope  was removed.  The semi-rigid ureteroscope was then advanced.  The scope  was initially not able to be manipulated into the ureteral orifice, as  this was quite tight.  We subsequently used an 8-French dilator over the  wire which passed easily.  This was removed, and the ureteroscope was  then able to be easily manipulated up the ureter.  There was a narrowing  of the ureter with some surrounding edema just distal to the stone.  Initial attempts at basket extraction of the stone were unsuccessful,  and we subsequently performed laser lithotripsy of the stone using the  holmium laser set.  The stone was fragmented into multiple small  fragments which were then removed with the basket.  The ureter was  examined to the level of the midureter, with no remaining stone  fragments.  The ureteroscope was removed.  The cystoscope was  reintroduced and stone fragments were removed from the bladder.  The  bladder was drained, and the cystoscope and wire  were removed.  There  were no complications throughout the case.  The patient was awakened and  transported to the recovery room in stable condition.  Please note Dr.  Retta Hendricks was present and participated in all aspects of this procedure,  as he was the primary surgeon.     ______________________________  Jonathan Hendricks, M.D.      Jonathan Hendricks, M.D.  Electronically Signed    Jonathan Hendricks/MEDQ  D:  05/25/2006  T:  05/25/2006  Job:  [id number]

## 2010-07-17 ENCOUNTER — Other Ambulatory Visit (INDEPENDENT_AMBULATORY_CARE_PROVIDER_SITE_OTHER): Payer: Self-pay

## 2010-07-17 DIAGNOSIS — B20 Human immunodeficiency virus [HIV] disease: Secondary | ICD-10-CM

## 2010-07-18 LAB — COMPLETE METABOLIC PANEL WITH GFR
BUN: 14 mg/dL (ref 6–23)
GFR, Est African American: >60 mL/min (ref >60)
GFR, Est Non African American: 57 mL/min — ABNORMAL LOW (ref >60)
Potassium: 4 mEq/L (ref 3.5–5.3)

## 2010-07-18 LAB — CBC WITH DIFFERENTIAL/PLATELET
Basophils Absolute: 0 10*3/uL (ref 0.0–0.1)
Eosinophils Relative: 2 % (ref 0–5)
HCT: 42.6 % (ref 39.0–52.0)
Hemoglobin: 14.6 g/dL (ref 13.0–17.0)
Lymphocytes Relative: 40 % (ref 12–46)
MCH: 32.6 pg (ref 26.0–34.0)
MCV: 95.1 fL (ref 78.0–100.0)
Monocytes Absolute: 0.6 10*3/uL (ref 0.1–1.0)
Neutro Abs: 4.4 10*3/uL (ref 1.7–7.7)
RBC: 4.48 MIL/uL (ref 4.22–5.81)

## 2010-07-18 LAB — T-HELPER CELL (CD4) - (RCID CLINIC ONLY)
CD4 % Helper T Cell: 28 % — ABNORMAL LOW (ref 33–55)
CD4 T Cell Abs: 980 uL (ref 400–2700)

## 2010-07-18 LAB — GC/CHLAMYDIA PROBE AMP, URINE
Chlamydia, Swab/Urine, PCR: NEGATIVE
GC Probe Amp, Urine: NEGATIVE

## 2010-07-18 LAB — HIV-1 RNA QUANT-NO REFLEX-BLD: HIV 1 RNA Quant: <20 copies/mL (ref <20)

## 2010-07-31 ENCOUNTER — Ambulatory Visit: Payer: Self-pay | Admitting: Adult Health

## 2010-07-31 ENCOUNTER — Encounter: Payer: Self-pay | Admitting: Adult Health

## 2010-07-31 ENCOUNTER — Ambulatory Visit (INDEPENDENT_AMBULATORY_CARE_PROVIDER_SITE_OTHER): Payer: Self-pay | Admitting: Adult Health

## 2010-07-31 DIAGNOSIS — B20 Human immunodeficiency virus [HIV] disease: Secondary | ICD-10-CM

## 2010-07-31 DIAGNOSIS — Z79899 Other long term (current) drug therapy: Secondary | ICD-10-CM

## 2010-07-31 MED ORDER — EFAVIRENZ-EMTRICITAB-TENOFOVIR 600-200-300 MG PO TABS: 1.0000 | ORAL_TABLET | Freq: Every day | ORAL | Status: DC

## 2010-08-11 ENCOUNTER — Other Ambulatory Visit: Payer: Self-pay | Admitting: *Deleted

## 2010-08-11 DIAGNOSIS — B009 Herpesviral infection, unspecified: Secondary | ICD-10-CM

## 2010-08-11 DIAGNOSIS — B20 Human immunodeficiency virus [HIV] disease: Secondary | ICD-10-CM

## 2010-08-11 MED ORDER — ACYCLOVIR 200 MG PO CAPS: 200.0000 mg | ORAL_CAPSULE | Freq: Every day | ORAL | Status: DC

## 2010-10-09 ENCOUNTER — Encounter (INDEPENDENT_AMBULATORY_CARE_PROVIDER_SITE_OTHER): Payer: Self-pay | Admitting: Surgery

## 2010-10-10 ENCOUNTER — Ambulatory Visit (INDEPENDENT_AMBULATORY_CARE_PROVIDER_SITE_OTHER): Payer: Self-pay | Admitting: General Surgery

## 2010-12-12 ENCOUNTER — Ambulatory Visit: Payer: Self-pay

## 2011-01-22 ENCOUNTER — Encounter: Payer: Self-pay | Admitting: *Deleted

## 2011-04-05 NOTE — Progress Notes (Signed)
Subjective:    Patient ID: Jonathan Hendricks is a 51 y.o. male.  Chief Complaint: HIV Follow-up Visit SELDEN NOTEBOOM is here for follow-up of HIV infection. He is feeling unchanged since his last visit.  He claims continued adherence to therapy with good tolerance and no complications. There are not additional complaints.   Data Review: Diagnostic studies reviewed.  Review of Systems - General ROS: negative for - fatigue, malaise, night sweats or sleep disturbance Psychological ROS: negative for - anxiety, behavioral disorder, concentration difficulties, depression, memory difficulties or mood swings Ophthalmic ROS: negative ENT ROS: negative Respiratory ROS: no cough, shortness of breath, or wheezing Cardiovascular ROS: no chest pain or dyspnea on exertion Gastrointestinal ROS: no abdominal pain, change in bowel habits, or black or bloody stools Genito-Urinary ROS: no dysuria, trouble voiding, or hematuria Musculoskeletal ROS: negative Neurological ROS: no TIA or stroke symptoms Dermatological ROS: negative for pruritus, rash and skin lesion changes   Objective:   General appearance: alert, cooperative and no distress Head: Normocephalic, without obvious abnormality, atraumatic Eyes: conjunctivae/corneas clear. PERRL, EOM's intact. Fundi benign. Ears: normal TM's and external ear canals both ears Nose: Nares normal. Septum midline. Mucosa normal. No drainage or sinus tenderness. Throat: lips, mucosa, and tongue normal; teeth and gums normal Resp: clear to auscultation bilaterally Cardio: regular rate and rhythm, S1, S2 normal, no murmur, click, rub or gallop GI: soft, non-tender; bowel sounds normal; no masses,  no organomegaly Extremities: extremities normal, atraumatic, no cyanosis or edema Skin: Skin color, texture, turgor normal. No rashes or lesions Neurologic: Alert and oriented X 3, normal strength and tone. Normal symmetric reflexes. Normal coordination and gait Psych:  No  vegetative signs or delusional behaviors noted.    Laboratory:  From 07/17/2010  ,  CD4 count was 980 c/cmm @ 28%. Viral load <20 copies/ml.     Assessment/Plan:   Human immunodeficiency virus (HIV) disease Clinically stable on current regimen. Continue present management.  Counseling provided on prevention of transmission of HIV. Condoms offered:  Yes Medication adherence discussed with patient.  Follow up visit in 6  months with labs 2 weeks prior to appointment. Patient verbally acknowledged information provided to them and agreed with plan of care.      Estevan Kersh A. Sundra Aland, MS, [facility] for Infectious Disease [phone]  04/05/2011, 12:41 PM

## 2011-04-05 NOTE — Assessment & Plan Note (Addendum)
Clinically stable on current regimen. Continue present management.  Counseling provided on prevention of transmission of HIV. Condoms offered:  Yes Medication adherence discussed with patient.  Follow up visit in 6  months with labs 2 weeks prior to appointment. Patient verbally acknowledged information provided to them and agreed with plan of care.

## 2011-04-13 ENCOUNTER — Telehealth: Payer: Self-pay | Admitting: Adult Health

## 2011-04-13 NOTE — Telephone Encounter (Signed)
Received fax from [employer].  Time for Jonathan Hendricks to renew his application for his [employer].  Called and left a V/M for him to return my call regarding this.

## 2011-04-20 ENCOUNTER — Telehealth: Payer: Self-pay | Admitting: Internal Medicine

## 2011-04-20 NOTE — Telephone Encounter (Signed)
V/M from Jonathan Hendricks.  Returned his call.  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  Set appointment for March 21 at 1:30

## 2011-05-05 ENCOUNTER — Other Ambulatory Visit: Payer: Self-pay | Admitting: Licensed Clinical Social Worker

## 2011-05-05 ENCOUNTER — Other Ambulatory Visit: Payer: Self-pay | Admitting: *Deleted

## 2011-05-05 ENCOUNTER — Ambulatory Visit: Payer: Self-pay

## 2011-05-05 ENCOUNTER — Other Ambulatory Visit: Payer: Self-pay

## 2011-05-05 ENCOUNTER — Telehealth: Payer: Self-pay | Admitting: Infectious Disease

## 2011-05-05 ENCOUNTER — Ambulatory Visit: Payer: Self-pay | Admitting: *Deleted

## 2011-05-05 VITALS — BP 158/116 | HR 82

## 2011-05-05 DIAGNOSIS — Z79899 Other long term (current) drug therapy: Secondary | ICD-10-CM

## 2011-05-05 DIAGNOSIS — B20 Human immunodeficiency virus [HIV] disease: Secondary | ICD-10-CM

## 2011-05-05 DIAGNOSIS — Z113 Encounter for screening for infections with a predominantly sexual mode of transmission: Secondary | ICD-10-CM

## 2011-05-05 DIAGNOSIS — I10 Essential (primary) hypertension: Secondary | ICD-10-CM

## 2011-05-05 LAB — CBC WITH DIFFERENTIAL/PLATELET
Basophils Absolute: 0 10*3/uL (ref 0.0–0.1)
Basophils Relative: 0 % (ref 0–1)
Eosinophils Relative: 3 % (ref 0–5)
HCT: 46.1 % (ref 39.0–52.0)
Hemoglobin: 15.8 g/dL (ref 13.0–17.0)
Lymphocytes Relative: 44 % (ref 12–46)
MCHC: 34.3 g/dL (ref 30.0–36.0)
MCV: 93.3 fL (ref 78.0–100.0)
Monocytes Absolute: 0.5 10*3/uL (ref 0.1–1.0)
Monocytes Relative: 7 % (ref 3–12)
Neutro Abs: 3.3 10*3/uL (ref 1.7–7.7)
RDW: 12.7 % (ref 11.5–15.5)

## 2011-05-05 LAB — COMPLETE METABOLIC PANEL WITH GFR
ALT: 50 U/L (ref 0–53)
AST: 33 U/L (ref 0–37)
CO2: 27 mEq/L (ref 19–32)
Creat: 1.33 mg/dL (ref 0.50–1.35)
Sodium: 137 mEq/L (ref 135–145)
Total Bilirubin: 0.6 mg/dL (ref 0.3–1.2)
Total Protein: 7.9 g/dL (ref 6.0–8.3)

## 2011-05-05 LAB — LIPID PANEL
HDL: 33 mg/dL — ABNORMAL LOW (ref >39)
LDL Cholesterol: 107 mg/dL — ABNORMAL HIGH (ref 0–99)
Triglycerides: 306 mg/dL — ABNORMAL HIGH (ref <150)
VLDL: 61 mg/dL — ABNORMAL HIGH (ref 0–40)

## 2011-05-05 LAB — RPR

## 2011-05-05 MED ORDER — EFAVIRENZ-EMTRICITAB-TENOFOVIR 600-200-300 MG PO TABS: 1.0000 | ORAL_TABLET | Freq: Every day | ORAL | Status: DC

## 2011-05-05 NOTE — Telephone Encounter (Signed)
Faxed and mailed application to [employer] Patient Assistance today.

## 2011-05-05 NOTE — Progress Notes (Signed)
Discussed salt & bp. Gave examples of what to avoid. States he sometimes gets dizzy & has a HA at times.  Got labs done today & made him an appt to see md asap. He can't get here until 05/18/11. Tomasita Morrow, RN made him an appt.

## 2011-05-06 LAB — T-HELPER CELL (CD4) - (RCID CLINIC ONLY)
CD4 % Helper T Cell: 25 % — ABNORMAL LOW (ref 33–55)
CD4 T Cell Abs: 820 uL (ref 400–2700)

## 2011-05-06 LAB — URINALYSIS, ROUTINE W REFLEX MICROSCOPIC
Bilirubin Urine: NEGATIVE
Glucose, UA: NEGATIVE mg/dL
Hgb urine dipstick: NEGATIVE
Protein, ur: NEGATIVE mg/dL
pH: 6.5 (ref 5.0–8.0)

## 2011-05-07 LAB — HIV-1 RNA QUANT-NO REFLEX-BLD: HIV-1 RNA Quant, Log: <1.3 {Log} (ref <1.30)

## 2011-05-11 ENCOUNTER — Telehealth: Payer: Self-pay | Admitting: *Deleted

## 2011-05-11 NOTE — Telephone Encounter (Signed)
[employer] pt assistance sent a fax asking for confirmation of SS #. It was off by 1 number. ADAP is still pending. refaxed back to them

## 2011-05-14 ENCOUNTER — Telehealth: Payer: Self-pay | Admitting: Infectious Disease

## 2011-05-14 NOTE — Telephone Encounter (Signed)
Received fax from [employer] Patient Assistance.  Approved for temporary assistance.  Will receive a 90 day supply while pending ADAP.

## 2011-05-18 ENCOUNTER — Ambulatory Visit (INDEPENDENT_AMBULATORY_CARE_PROVIDER_SITE_OTHER): Payer: Self-pay | Admitting: Infectious Disease

## 2011-05-18 ENCOUNTER — Encounter: Payer: Self-pay | Admitting: Infectious Disease

## 2011-05-18 VITALS — BP 153/109 | HR 79 | Temp 98.2°F | Wt 211.0 lb

## 2011-05-18 DIAGNOSIS — B009 Herpesviral infection, unspecified: Secondary | ICD-10-CM

## 2011-05-18 DIAGNOSIS — Z23 Encounter for immunization: Secondary | ICD-10-CM

## 2011-05-18 DIAGNOSIS — B20 Human immunodeficiency virus [HIV] disease: Secondary | ICD-10-CM

## 2011-05-18 DIAGNOSIS — I10 Essential (primary) hypertension: Secondary | ICD-10-CM

## 2011-05-18 MED ORDER — ACYCLOVIR 200 MG PO CAPS: 200.0000 mg | ORAL_CAPSULE | Freq: Every day | ORAL | Status: DC

## 2011-05-18 MED ORDER — EFAVIRENZ-EMTRICITAB-TENOFOVIR 600-200-300 MG PO TABS: 1.0000 | ORAL_TABLET | Freq: Every day | ORAL | Status: DC

## 2011-05-18 MED ORDER — HYDROCHLOROTHIAZIDE 25 MG PO TABS: 25.0000 mg | ORAL_TABLET | Freq: Every day | ORAL | Status: DC

## 2011-05-18 NOTE — Progress Notes (Signed)
  Subjective:    Patient ID: Jonathan Hendricks, male    DOB: [birth year], 51 y.o.   MRN: [id number]  HPI  51 year old with HIV who has managed to control his HIV with EVERY OTHER DAY atripla (we did not know this till today) that he was doing to help "stretch my meds out." I told him that this was a dangerous game to be playing and I asked him to please, please, please take the meds every single day. HIs BP is also not well controlled and we will add in HCTZ. He is not on adap at present (makes too much $ last year) but is on assistance from [employer]. Otherwise he is doing relatively well. Asked to vaccinate for hep a, pneumo and flu.  Review of Systems  Constitutional: Negative for fever, chills, diaphoresis, activity change, appetite change, fatigue and unexpected weight change.  HENT: Negative for congestion, sore throat, rhinorrhea, sneezing, trouble swallowing and sinus pressure.   Eyes: Negative for photophobia and visual disturbance.  Respiratory: Negative for cough, chest tightness, shortness of breath, wheezing and stridor.   Cardiovascular: Negative for chest pain, palpitations and leg swelling.  Gastrointestinal: Negative for nausea, vomiting, abdominal pain, diarrhea, constipation, blood in stool, abdominal distention and anal bleeding.  Genitourinary: Negative for dysuria, hematuria, flank pain and difficulty urinating.  Musculoskeletal: Negative for myalgias, back pain, joint swelling, arthralgias and gait problem.  Skin: Negative for color change, pallor, rash and wound.  Neurological: Negative for dizziness, tremors, weakness and light-headedness.  Hematological: Negative for adenopathy. Does not bruise/bleed easily.  Psychiatric/Behavioral: Negative for behavioral problems, confusion, sleep disturbance, dysphoric mood, decreased concentration and agitation.       Objective:   Physical Exam  Constitutional: He is oriented to person, place, and time. He appears well-developed and  well-nourished. No distress.  HENT:  Head: Normocephalic and atraumatic.  Mouth/Throat: Oropharynx is clear and moist. No oropharyngeal exudate.  Eyes: Conjunctivae and EOM are normal. Pupils are equal, round, and reactive to light. No scleral icterus.  Neck: Normal range of motion. Neck supple. No JVD present.  Cardiovascular: Normal rate, regular rhythm and normal heart sounds.  Exam reveals no gallop and no friction rub.   No murmur heard. Pulmonary/Chest: Effort normal and breath sounds normal. No respiratory distress. He has no wheezes. He has no rales. He exhibits no tenderness.  Abdominal: He exhibits no distension and no mass. There is no tenderness. There is no rebound and no guarding.  Musculoskeletal: He exhibits no edema and no tenderness.  Lymphadenopathy:    He has no cervical adenopathy.  Neurological: He is alert and oriented to person, place, and time. He has normal reflexes. He exhibits normal muscle tone. Coordination normal.  Skin: Skin is warm and dry. He is not diaphoretic. No erythema. No pallor.  Psychiatric: He has a normal mood and affect. His behavior is normal. Judgment and thought content normal.          Assessment & Plan:  Human immunodeficiency virus (HIV) disease Continue atripla. Admonished him to please please take every DAY!  HTN (hypertension) Add hctz

## 2011-05-18 NOTE — Assessment & Plan Note (Signed)
Add hctz 

## 2011-05-18 NOTE — Assessment & Plan Note (Signed)
Continue [employer]a. Admonished him to please please take every DAY!

## 2011-05-26 ENCOUNTER — Ambulatory Visit: Payer: Self-pay | Admitting: Internal Medicine

## 2011-06-01 ENCOUNTER — Telehealth: Payer: Self-pay | Admitting: Infectious Disease

## 2011-06-01 NOTE — Telephone Encounter (Signed)
Received fax from [employer] about denial for ADAP.  I called the patient and told him he had been denied.  I also told him he had been approved by [employer] patient assistance for [facility] 05-08-11 - 05-07-12.  I told him if he had any questions to call me back.

## 2011-07-25 ENCOUNTER — Other Ambulatory Visit: Payer: Self-pay | Admitting: Adult Health

## 2011-07-25 DIAGNOSIS — B20 Human immunodeficiency virus [HIV] disease: Secondary | ICD-10-CM

## 2011-08-14 ENCOUNTER — Other Ambulatory Visit: Payer: Self-pay | Admitting: *Deleted

## 2011-08-14 DIAGNOSIS — B009 Herpesviral infection, unspecified: Secondary | ICD-10-CM

## 2011-08-14 MED ORDER — ACYCLOVIR 200 MG PO CAPS: 200.0000 mg | ORAL_CAPSULE | Freq: Every day | ORAL | Status: DC

## 2011-08-30 ENCOUNTER — Emergency Department (HOSPITAL_COMMUNITY)
Admission: EM | Admit: 2011-08-30 | Discharge: 2011-08-30 | Disposition: A | Discharge status: Home / Self Care | Payer: Self-pay | Source: Home / Self Care | Attending: Family Medicine | Admitting: Family Medicine

## 2011-08-30 ENCOUNTER — Telehealth (HOSPITAL_COMMUNITY): Payer: Self-pay | Admitting: Family Medicine

## 2011-08-30 ENCOUNTER — Encounter (HOSPITAL_COMMUNITY): Payer: Self-pay

## 2011-08-30 DIAGNOSIS — H109 Unspecified conjunctivitis: Secondary | ICD-10-CM

## 2011-08-30 HISTORY — DX: Human immunodeficiency virus (HIV) disease: B20

## 2011-08-30 MED ORDER — PREDNISOLONE SODIUM PHOSPHATE 1 % OP SOLN: 1.0000 [drp] | Freq: Three times a day (TID) | OPHTHALMIC | Status: AC

## 2011-08-30 MED ORDER — MOXIFLOXACIN HCL 0.5 % OP SOLN: 1.0000 [drp] | Freq: Three times a day (TID) | OPHTHALMIC | Status: AC

## 2011-08-30 NOTE — ED Notes (Signed)
Patient called with question regarding medication.  RX for Vigamox states use in left eye.  Pt states he has infection in both eyes.  Chart reviewed and patient advised that he should but drops in both eyes.  The instructions were written incorrectly.  Patient then requesting a cheaper medication.  Patient had already purchased medication.  I informed patient I did not think that he would be ale to return medication.  I instructed him to call pharmacy and ask.  If they will accept the return he should call us and we will look for an alternative.  Patient continued to complain about prices of medication and is also requesting an oral medication.  I explained to patient we do not give oral medications for eye infections.  Patient expressed understanding

## 2011-08-30 NOTE — Discharge Instructions (Signed)
Warm soak to eyes before using eye drops, use medicine for 7 days.

## 2011-08-30 NOTE — ED Provider Notes (Signed)
History     CSN: [id number]  Arrival date & time 08/30/11  1323   First MD Initiated Contact with Patient 08/30/11 1350      Chief Complaint  Patient presents with  . Eye Problem    (Consider location/radiation/quality/duration/timing/severity/associated sxs/prior treatment) Patient is a 51 y.o. male presenting with eye problem. The history is provided by the patient.  Eye Problem  This is a new problem. The current episode started yesterday. The problem has been gradually worsening. There is pain in both eyes. There was no injury mechanism. The patient is experiencing no pain. There is no history of trauma to the eye. There is no known exposure to pink eye. He does not wear contacts. Associated symptoms include discharge and eye redness. Pertinent negatives include no blurred vision, no decreased vision and no photophobia.    Past Medical History  Diagnosis Date  . IBD (inflammatory bowel disease)   . Constipation   . Rectal pain   . HIV disease     History reviewed. No pertinent past surgical history.  History reviewed. No pertinent family history.  History  Substance Use Topics  . Smoking status: Never Smoker   . Smokeless tobacco: Never Used  . Alcohol Use: No      Review of Systems  Constitutional: Negative.   HENT: Negative.   Eyes: Positive for discharge and redness. Negative for blurred vision, photophobia, pain and visual disturbance.    Allergies  Review of patient's allergies indicates no known allergies.  Home Medications   Current Outpatient Rx  Name Route Sig Dispense Refill  . OVER THE COUNTER MEDICATION  otc allergy eye gtts    . ACYCLOVIR 200 MG PO CAPS Oral Take 1 capsule (200 mg total) by mouth daily. 30 capsule 6  . ATRIPLA 600-200-300 MG PO TABS  TAKE 1 TABLET AT BEDTIME 30 tablet 5  . CLOTRIMAZOLE-BETAMETHASONE 1-0.05 % EX CREA Topical Apply 1 application topically 2 (two) times daily.      Marland Kitchen HYDROCHLOROTHIAZIDE 25 MG PO TABS Oral Take 1  tablet (25 mg total) by mouth daily. 30 tablet 11  . LOPERAMIDE HCL 2 MG PO TABS Oral Take 2 mg by mouth 2 (two) times daily.      Marland Kitchen MOXIFLOXACIN HCL 0.5 % OP SOLN Left Eye Place 1 drop into the left eye 3 (three) times daily. 3 mL 0  . PREDNISOLONE SODIUM PHOSPHATE 1 % OP SOLN Both Eyes Place 1 drop into both eyes 3 (three) times daily. 5 mL 0    BP 149/100  Pulse 77  Temp 98.2 F (36.8 C) (Oral)  Resp 18  SpO2 99%  Physical Exam  Nursing note and vitals reviewed. Constitutional: He is oriented to person, place, and time. He appears well-developed and well-nourished.  HENT:  Head: Normocephalic.  Eyes: EOM are normal. Pupils are equal, round, and reactive to light. No foreign bodies found. Right eye exhibits discharge. Left eye exhibits discharge. Right conjunctiva is injected. Right conjunctiva has no hemorrhage. Left conjunctiva is injected. Left conjunctiva has no hemorrhage.    Neck: Normal range of motion. Neck supple.  Lymphadenopathy:    He has no cervical adenopathy.  Neurological: He is alert and oriented to person, place, and time.    ED Course  Procedures (including critical care time)  Labs Reviewed - No data to display No results found.   1. Conjunctivitis of both eyes       MDM  Linna Hoff, MD 08/30/11 1447

## 2011-08-30 NOTE — ED Notes (Signed)
Pt has irritated, red painful eyes, started on the left and today rt is involved.  Denies exposure to chemicals or smoke.

## 2011-08-31 ENCOUNTER — Telehealth (HOSPITAL_COMMUNITY): Payer: Self-pay | Admitting: *Deleted

## 2011-08-31 NOTE — ED Notes (Signed)
[name] at [facility] called and said the [employer] is on back order and asked if it could be changed to Prednisone Acetate. I asked Dr. Artis Flock and he approved change with same sig.  Pharmacist notified. Pt. called one minute later saying that the pharmacy can't get the medication and his eyes are burning. I told him I had just talked to [facility] and Dr. Artis Flock approved changing it to Prednisone Acetate. Jonathan Hendricks 08/31/2011

## 2011-11-16 ENCOUNTER — Telehealth: Payer: Self-pay | Admitting: Infectious Disease

## 2011-11-16 NOTE — Telephone Encounter (Signed)
Faxed 6 month questions back to [employer] Patient Assistance.

## 2011-12-28 ENCOUNTER — Encounter: Payer: Self-pay | Admitting: Infectious Disease

## 2011-12-28 ENCOUNTER — Ambulatory Visit (INDEPENDENT_AMBULATORY_CARE_PROVIDER_SITE_OTHER): Payer: Self-pay | Admitting: Infectious Disease

## 2011-12-28 VITALS — BP 144/98 | HR 79 | Temp 98.0°F | Ht 72.0 in | Wt 206.0 lb

## 2011-12-28 DIAGNOSIS — I10 Essential (primary) hypertension: Secondary | ICD-10-CM

## 2011-12-28 DIAGNOSIS — B20 Human immunodeficiency virus [HIV] disease: Secondary | ICD-10-CM

## 2011-12-28 DIAGNOSIS — K469 Unspecified abdominal hernia without obstruction or gangrene: Secondary | ICD-10-CM | POA: Insufficient documentation

## 2011-12-28 DIAGNOSIS — B9689 Other specified bacterial agents as the cause of diseases classified elsewhere: Secondary | ICD-10-CM | POA: Insufficient documentation

## 2011-12-28 DIAGNOSIS — Z21 Asymptomatic human immunodeficiency virus [HIV] infection status: Secondary | ICD-10-CM

## 2011-12-28 DIAGNOSIS — F329 Major depressive disorder, single episode, unspecified: Secondary | ICD-10-CM

## 2011-12-28 DIAGNOSIS — Z23 Encounter for immunization: Secondary | ICD-10-CM

## 2011-12-28 DIAGNOSIS — J329 Chronic sinusitis, unspecified: Secondary | ICD-10-CM

## 2011-12-28 DIAGNOSIS — A499 Bacterial infection, unspecified: Secondary | ICD-10-CM

## 2011-12-28 LAB — LIPID PANEL
Cholesterol: 202 mg/dL — ABNORMAL HIGH (ref 0–200)
HDL: 31 mg/dL — ABNORMAL LOW (ref >39)
LDL Cholesterol: 116 mg/dL — ABNORMAL HIGH (ref 0–99)
Total CHOL/HDL Ratio: 6.5 ratio
Triglycerides: 276 mg/dL — ABNORMAL HIGH (ref <150)
VLDL: 55 mg/dL — ABNORMAL HIGH (ref 0–40)

## 2011-12-28 LAB — CBC WITH DIFFERENTIAL/PLATELET
Basophils Absolute: 0 K/uL (ref 0.0–0.1)
Basophils Relative: 0 % (ref 0–1)
Eosinophils Absolute: 0.2 K/uL (ref 0.0–0.7)
Eosinophils Relative: 2 % (ref 0–5)
HCT: 44.5 % (ref 39.0–52.0)
Hemoglobin: 15.7 g/dL (ref 13.0–17.0)
Lymphocytes Relative: 32 % (ref 12–46)
Lymphs Abs: 2.8 K/uL (ref 0.7–4.0)
MCH: 31.7 pg (ref 26.0–34.0)
MCHC: 35.3 g/dL (ref 30.0–36.0)
MCV: 89.9 fL (ref 78.0–100.0)
Monocytes Absolute: 0.7 K/uL (ref 0.1–1.0)
Monocytes Relative: 7 % (ref 3–12)
Neutro Abs: 5.2 K/uL (ref 1.7–7.7)
Neutrophils Relative %: 59 % (ref 43–77)
Platelets: 396 K/uL (ref 150–400)
RBC: 4.95 MIL/uL (ref 4.22–5.81)
RDW: 13.3 % (ref 11.5–15.5)
WBC: 9 K/uL (ref 4.0–10.5)

## 2011-12-28 LAB — HEPATITIS C ANTIBODY: HCV Ab: NEGATIVE

## 2011-12-28 MED ORDER — EFAVIRENZ-EMTRICITAB-TENOFOVIR 600-200-300 MG PO TABS: 1.0000 | ORAL_TABLET | Freq: Every day | ORAL | Status: DC

## 2011-12-28 MED ORDER — OXYCODONE-ACETAMINOPHEN 10-650 MG PO TABS: 1.0000 | ORAL_TABLET | Freq: Two times a day (BID) | ORAL | Status: DC

## 2011-12-28 MED ORDER — AMOXICILLIN 500 MG PO CAPS: 500.0000 mg | ORAL_CAPSULE | Freq: Three times a day (TID) | ORAL | Status: DC

## 2011-12-28 NOTE — Progress Notes (Signed)
Subjective:    Patient ID: Jonathan Hendricks, male    DOB: February 20, 1961, 51 y.o.   MRN: [id number]  HPI  51 year old with HIV who has managed to control his HIV with EVERY OTHER DAY and whom I saw in March of 2013 and hope to have corrected that bad habit.  He has multiple complaints today and problems.  He is now with significant pay drop to half of his previous salary and would seem to be candidate for ADAP. He has met with Britta Mccreedy and gotten plugged into [facility].  He is suffering from depression due to loss of contact with friend who is a pt here, reduction in work load and mx physical ailments. He did not want an antidepressant or anxiolytic.  He also is suffering from 3 months of sinus congestion, cough that has not improved. At times has had facial pain. No fevers. Failing OTC meds.  Finally he has had more than a year of abdominal pain with what appears on exam to be a classic midline abdominal hernia. He has constant dulll ache related to this pain and this can worsen at night esp. He has has no hx of abdominal surgery.  I spent greater than 45 minutes with the patient including greater than 50% of time in face to face counsel of the patient and in coordination of their care.    Review of Systems  Constitutional: Negative for fever, chills, diaphoresis, activity change, appetite change, fatigue and unexpected weight change.  HENT: Positive for congestion, postnasal drip and sinus pressure. Negative for sore throat, rhinorrhea, sneezing and trouble swallowing.   Eyes: Negative for photophobia and visual disturbance.  Respiratory: Positive for cough. Negative for chest tightness, shortness of breath, wheezing and stridor.   Cardiovascular: Negative for chest pain, palpitations and leg swelling.  Gastrointestinal: Positive for abdominal pain. Negative for nausea, vomiting, diarrhea, constipation, blood in stool, abdominal distention and anal bleeding.  Genitourinary: Negative for  dysuria, hematuria, flank pain and difficulty urinating.  Musculoskeletal: Negative for myalgias, back pain, joint swelling, arthralgias and gait problem.  Skin: Negative for color change, pallor, rash and wound.  Neurological: Positive for headaches. Negative for dizziness, tremors, weakness and light-headedness.  Hematological: Negative for adenopathy. Does not bruise/bleed easily.  Psychiatric/Behavioral: Positive for dysphoric mood. Negative for behavioral problems, confusion, sleep disturbance, decreased concentration and agitation. The patient is nervous/anxious.        Objective:   Physical Exam  Constitutional: He is oriented to person, place, and time. He appears well-developed and well-nourished. No distress.  HENT:  Head: Normocephalic and atraumatic.  Nose: Mucosal edema and rhinorrhea present. Right sinus exhibits frontal sinus tenderness. Left sinus exhibits frontal sinus tenderness.  Mouth/Throat: Oropharynx is clear and moist. No oropharyngeal exudate.  Eyes: Conjunctivae normal and EOM are normal. Pupils are equal, round, and reactive to light. No scleral icterus.  Neck: Normal range of motion. Neck supple. No JVD present.  Cardiovascular: Normal rate, regular rhythm and normal heart sounds.  Exam reveals no gallop and no friction rub.   No murmur heard. Pulmonary/Chest: Effort normal and breath sounds normal. No respiratory distress. He has no wheezes. He has no rales. He exhibits no tenderness.  Abdominal: He exhibits no distension. There is tenderness. There is no rebound. A hernia is present.    Musculoskeletal: He exhibits no edema and no tenderness.  Lymphadenopathy:    He has no cervical adenopathy.  Neurological: He is alert and oriented to person, place, and time.  He has normal reflexes. He exhibits normal muscle tone. Coordination normal.  Skin: Skin is warm and dry. He is not diaphoretic. No erythema. No pallor.  Psychiatric: His behavior is normal. Judgment  and thought content normal. He exhibits a depressed mood.          Assessment & Plan:   HIV: will consider change to complera or stribild in future. He wishes to continue atripla for now since h has the med and he is uncertain of ADAP, assistance  Hernia: refer to General surgery  Depression: consider change off of atripla as above. Offered SSRI. Would like him to see [facility]  Sinusitis: will give amoxicillin  Headaches" sound like they may be migrainous, could also be relate to BP. Will give him percocet for now as he has no insurance to cover migraine meds and needs something for  Sever pain for ha or hernia  HTN: likely needs a 2nd drug. Advised to get home bp cuff

## 2011-12-28 NOTE — Patient Instructions (Addendum)
See about ADAP  YOU NEED TO SEE SURGEON RE YOUR HERNIA  MAKE APPT WITH OUR COUNSELOR AS WELL

## 2011-12-29 LAB — COMPLETE METABOLIC PANEL WITH GFR
BUN: 15 mg/dL (ref 6–23)
CO2: 32 mEq/L (ref 19–32)
Creat: 1.34 mg/dL (ref 0.50–1.35)
GFR, Est African American: 70 mL/min
GFR, Est Non African American: 61 mL/min
Glucose, Bld: 101 mg/dL — ABNORMAL HIGH (ref 70–99)
Total Bilirubin: 0.5 mg/dL (ref 0.3–1.2)

## 2011-12-29 LAB — MICROALBUMIN / CREATININE URINE RATIO
Microalb Creat Ratio: 36.1 mg/g — ABNORMAL HIGH (ref 0.0–30.0)
Microalb, Ur: 7.5 mg/dL — ABNORMAL HIGH (ref 0.00–1.89)

## 2011-12-29 LAB — T-HELPER CELL (CD4) - (RCID CLINIC ONLY): CD4 T Cell Abs: 750 uL (ref 400–2700)

## 2011-12-30 ENCOUNTER — Ambulatory Visit: Payer: Medicaid Other | Admitting: Infectious Disease

## 2012-01-01 ENCOUNTER — Ambulatory Visit: Payer: Medicaid Other

## 2012-01-11 ENCOUNTER — Telehealth: Payer: Self-pay | Admitting: Infectious Disease

## 2012-01-11 ENCOUNTER — Telehealth: Payer: Self-pay | Admitting: *Deleted

## 2012-01-11 DIAGNOSIS — I10 Essential (primary) hypertension: Secondary | ICD-10-CM

## 2012-01-11 MED ORDER — LISINOPRIL 20 MG PO TABS: 20.0000 mg | ORAL_TABLET | Freq: Every day | ORAL | Status: DC

## 2012-01-11 NOTE — Telephone Encounter (Signed)
[name] can you let Jonathan Hendricks know that based on his labs which show some proteint leakage into his urine that I think he needs antohter drug (lisinopril) added to control his BP. He should pick thi sup this week and come back for a BMP lab draw in a week and BP check on HCTZ and lisinopril

## 2012-01-11 NOTE — Telephone Encounter (Signed)
perfect

## 2012-01-11 NOTE — Telephone Encounter (Signed)
Patient has concerns that lisinopril will interfere with hi sexual performance and wants to be sure this is not a side effect before getting this filled. Wendall Mola CMA

## 2012-01-11 NOTE — Telephone Encounter (Signed)
I called the patient and he will call back and schedule a lab appointment and blood pressure check.

## 2012-01-12 NOTE — Telephone Encounter (Signed)
IT IS NOT  A side effect of an ACEI which we are rx

## 2012-01-13 NOTE — Telephone Encounter (Signed)
Patient notified

## 2012-03-21 DIAGNOSIS — M6208 Separation of muscle (nontraumatic), other site: Secondary | ICD-10-CM | POA: Insufficient documentation

## 2012-03-21 DIAGNOSIS — Q792 Exomphalos: Secondary | ICD-10-CM | POA: Insufficient documentation

## 2012-03-21 DIAGNOSIS — B20 Human immunodeficiency virus [HIV] disease: Secondary | ICD-10-CM | POA: Insufficient documentation

## 2012-03-25 ENCOUNTER — Other Ambulatory Visit: Payer: Self-pay | Admitting: Infectious Disease

## 2012-04-27 ENCOUNTER — Other Ambulatory Visit: Payer: Self-pay

## 2012-04-29 ENCOUNTER — Ambulatory Visit: Payer: Self-pay

## 2012-04-29 ENCOUNTER — Other Ambulatory Visit: Payer: Self-pay

## 2012-05-06 ENCOUNTER — Other Ambulatory Visit (INDEPENDENT_AMBULATORY_CARE_PROVIDER_SITE_OTHER): Payer: Self-pay

## 2012-05-06 ENCOUNTER — Ambulatory Visit: Payer: Self-pay

## 2012-05-06 DIAGNOSIS — B20 Human immunodeficiency virus [HIV] disease: Secondary | ICD-10-CM

## 2012-05-06 DIAGNOSIS — Z113 Encounter for screening for infections with a predominantly sexual mode of transmission: Secondary | ICD-10-CM

## 2012-05-06 DIAGNOSIS — Z79899 Other long term (current) drug therapy: Secondary | ICD-10-CM

## 2012-05-06 LAB — CBC WITH DIFFERENTIAL/PLATELET
Basophils Absolute: 0 10*3/uL (ref 0.0–0.1)
Basophils Relative: 0 % (ref 0–1)
Eosinophils Absolute: 0.2 10*3/uL (ref 0.0–0.7)
Eosinophils Relative: 2 % (ref 0–5)
Lymphs Abs: 3.1 10*3/uL (ref 0.7–4.0)
MCH: 32.5 pg (ref 26.0–34.0)
MCV: 90.8 fL (ref 78.0–100.0)
Neutrophils Relative %: 47 % (ref 43–77)
Platelets: 321 10*3/uL (ref 150–400)
RBC: 4.56 MIL/uL (ref 4.22–5.81)
RDW: 13.8 % (ref 11.5–15.5)

## 2012-05-06 LAB — LIPID PANEL
Cholesterol: 172 mg/dL (ref 0–200)
HDL: 29 mg/dL — ABNORMAL LOW (ref >39)
Total CHOL/HDL Ratio: 5.9 Ratio

## 2012-05-06 LAB — COMPLETE METABOLIC PANEL WITH GFR
AST: 30 U/L (ref 0–37)
Alkaline Phosphatase: 77 U/L (ref 39–117)
BUN: 16 mg/dL (ref 6–23)
Creat: 1.34 mg/dL (ref 0.50–1.35)
GFR, Est Non African American: 61 mL/min
Glucose, Bld: 110 mg/dL — ABNORMAL HIGH (ref 70–99)
Potassium: 4.4 mEq/L (ref 3.5–5.3)
Total Bilirubin: 0.6 mg/dL (ref 0.3–1.2)

## 2012-05-06 LAB — RPR

## 2012-05-06 LAB — T-HELPER CELL (CD4) - (RCID CLINIC ONLY): CD4 T Cell Abs: 780 uL (ref 400–2700)

## 2012-05-09 ENCOUNTER — Other Ambulatory Visit: Payer: Self-pay | Admitting: Infectious Disease

## 2012-05-09 DIAGNOSIS — B20 Human immunodeficiency virus [HIV] disease: Secondary | ICD-10-CM

## 2012-05-09 LAB — HIV-1 RNA QUANT-NO REFLEX-BLD: HIV 1 RNA Quant: <20 copies/mL (ref <20)

## 2012-05-09 MED ORDER — EFAVIRENZ-EMTRICITAB-TENOFOVIR 600-200-300 MG PO TABS: 1.0000 | ORAL_TABLET | Freq: Every day | ORAL | Status: DC

## 2012-05-11 ENCOUNTER — Ambulatory Visit: Payer: Self-pay | Admitting: Infectious Disease

## 2012-05-18 ENCOUNTER — Encounter: Payer: Self-pay | Admitting: Infectious Disease

## 2012-05-18 ENCOUNTER — Ambulatory Visit (INDEPENDENT_AMBULATORY_CARE_PROVIDER_SITE_OTHER): Payer: Self-pay | Admitting: Infectious Disease

## 2012-05-18 VITALS — BP 132/91 | HR 86 | Temp 98.5°F | Ht 72.0 in | Wt 211.0 lb

## 2012-05-18 DIAGNOSIS — K429 Umbilical hernia without obstruction or gangrene: Secondary | ICD-10-CM

## 2012-05-18 DIAGNOSIS — B20 Human immunodeficiency virus [HIV] disease: Secondary | ICD-10-CM

## 2012-05-18 DIAGNOSIS — I10 Essential (primary) hypertension: Secondary | ICD-10-CM

## 2012-05-18 DIAGNOSIS — F329 Major depressive disorder, single episode, unspecified: Secondary | ICD-10-CM

## 2012-05-18 NOTE — Progress Notes (Signed)
Subjective:    Patient ID: Jonathan Hendricks, male    DOB: April 14, 1960, 52 y.o.   MRN: [id number]  HPI   52 year old with HIV who has managed to control his HIV with [employer].   He is suffering from depression due to loss of contact with friend who is a pt here, reduction in work load and mx physical ailments. He did not want an antidepressant or anxiolytic so far.  I proposed change to complera for him and he was open to idea but wants to clear up his coverage of meds via ADAP vs Exchange (latter sounds highly problematic)  He was seen by [facility] General surgery who found him to have an umbilical hernia. Unfortunately due to his lack of healthcare coverage I would not proceed with surgery. This is a reason for his pursuit of the healthcare exchange which as mentioned above has been difficult for him to navigate and he is at high concerns for high cost of his antiretroviral medications. We will refer him to one of our counselors to see what options he will have here.  Is also had some lightheadedness and dizziness while being on both his hydrochlorothiazide and lisinopril. He did not take them today. I like him to come back later and have orthostatic blood pressure checks performed.  I spent greater than 45 minutes with the patient including greater than 50% of time in face to face counsel of the patient and in coordination of their care.    Review of Systems  Constitutional: Negative for fever, chills, diaphoresis, activity change, appetite change, fatigue and unexpected weight change.  HENT: Negative for congestion, sore throat, rhinorrhea, sneezing, trouble swallowing, postnasal drip and sinus pressure.   Eyes: Negative for photophobia and visual disturbance.  Respiratory: Negative for cough, chest tightness, shortness of breath, wheezing and stridor.   Cardiovascular: Negative for chest pain, palpitations and leg swelling.  Gastrointestinal: Positive for abdominal pain. Negative for nausea,  vomiting, diarrhea, constipation, blood in stool, abdominal distention and anal bleeding.  Genitourinary: Negative for dysuria, hematuria, flank pain and difficulty urinating.  Musculoskeletal: Negative for myalgias, back pain, joint swelling, arthralgias and gait problem.  Skin: Negative for color change, pallor, rash and wound.  Neurological: Positive for headaches. Negative for dizziness, tremors, weakness and light-headedness.  Hematological: Negative for adenopathy. Does not bruise/bleed easily.  Psychiatric/Behavioral: Positive for dysphoric mood. Negative for behavioral problems, confusion, sleep disturbance, decreased concentration and agitation. The patient is nervous/anxious.        Objective:   Physical Exam  Constitutional: He is oriented to person, place, and time. He appears well-developed and well-nourished. No distress.  HENT:  Head: Normocephalic and atraumatic.  Nose: Mucosal edema and rhinorrhea present. Right sinus exhibits frontal sinus tenderness. Left sinus exhibits frontal sinus tenderness.  Mouth/Throat: Oropharynx is clear and moist. No oropharyngeal exudate.  Eyes: Conjunctivae and EOM are normal. Pupils are equal, round, and reactive to light. No scleral icterus.  Neck: Normal range of motion. Neck supple. No JVD present.  Cardiovascular: Normal rate, regular rhythm and normal heart sounds.  Exam reveals no gallop and no friction rub.   No murmur heard. Pulmonary/Chest: Effort normal and breath sounds normal. No respiratory distress. He has no wheezes. He has no rales. He exhibits no tenderness.  Abdominal: He exhibits no distension. There is tenderness. There is no rebound. A hernia is present.    Musculoskeletal: He exhibits no edema and no tenderness.  Lymphadenopathy:    He has no cervical  adenopathy.  Neurological: He is alert and oriented to person, place, and time. He has normal reflexes. He exhibits normal muscle tone. Coordination normal.  Skin: Skin  is warm and dry. He is not diaphoretic. No erythema. No pallor.  Psychiatric: His behavior is normal. Judgment and thought content normal. He exhibits a depressed mood.          Assessment & Plan:   HIV: will consider change to complera in future. He wishes to continue atripla for now since h has the med and he is uncertain of ADAP, assistance. I will also offer him STRIIVING study (switch study) and he would be candidate for this and is interested int this study  Hernia: seen at [facility] surgery see above  discussion  Depression: consider change off of atripla as above. Offered SSRI. Would like him to see [facility]  HTN: We may be over treating this.  Dizziness and lightheadedness: May be do to overly aggressive blood pressure control. He'll come back for blood pressure check lying and standing, and that he has orthostatic vital sign changes are concerning we may drop one of his antihypertensives.

## 2012-05-18 NOTE — Patient Instructions (Addendum)
You should meet with a counselor to consider the different exchange options  I would like you to come in for RN visit this week for orthostatic vital signs   I would like you to consier the switch study

## 2012-05-27 ENCOUNTER — Other Ambulatory Visit: Payer: Self-pay | Admitting: Licensed Clinical Social Worker

## 2012-05-27 DIAGNOSIS — B20 Human immunodeficiency virus [HIV] disease: Secondary | ICD-10-CM

## 2012-05-27 MED ORDER — EFAVIRENZ-EMTRICITAB-TENOFOVIR 600-200-300 MG PO TABS: 1.0000 | ORAL_TABLET | Freq: Every day | ORAL | Status: DC

## 2012-06-03 ENCOUNTER — Encounter: Payer: Self-pay | Admitting: *Deleted

## 2012-06-03 ENCOUNTER — Ambulatory Visit: Payer: Self-pay | Admitting: *Deleted

## 2012-06-03 VITALS — BP 119/83

## 2012-06-03 DIAGNOSIS — I10 Essential (primary) hypertension: Secondary | ICD-10-CM

## 2012-06-08 ENCOUNTER — Other Ambulatory Visit: Payer: Self-pay | Admitting: Infectious Disease

## 2012-06-08 ENCOUNTER — Other Ambulatory Visit: Payer: Self-pay | Admitting: *Deleted

## 2012-06-08 DIAGNOSIS — B029 Zoster without complications: Secondary | ICD-10-CM

## 2012-06-08 DIAGNOSIS — I10 Essential (primary) hypertension: Secondary | ICD-10-CM

## 2012-06-08 MED ORDER — HYDROCHLOROTHIAZIDE 25 MG PO TABS: 25.0000 mg | ORAL_TABLET | Freq: Every day | ORAL | Status: DC

## 2012-06-08 MED ORDER — ACYCLOVIR 200 MG PO CAPS: 200.0000 mg | ORAL_CAPSULE | Freq: Every day | ORAL | Status: DC

## 2012-06-16 ENCOUNTER — Other Ambulatory Visit: Payer: Self-pay | Admitting: *Deleted

## 2012-06-16 NOTE — Telephone Encounter (Signed)
Spoke with [facility].  Already refilled that rx on 06/08/12.

## 2012-09-23 ENCOUNTER — Ambulatory Visit: Payer: Self-pay

## 2012-10-31 ENCOUNTER — Other Ambulatory Visit: Payer: Self-pay

## 2012-11-04 ENCOUNTER — Other Ambulatory Visit: Payer: Self-pay

## 2012-11-04 DIAGNOSIS — B20 Human immunodeficiency virus [HIV] disease: Secondary | ICD-10-CM

## 2012-11-04 LAB — COMPLETE METABOLIC PANEL WITH GFR
ALT: 76 U/L — ABNORMAL HIGH (ref 0–53)
AST: 37 U/L (ref 0–37)
Alkaline Phosphatase: 79 U/L (ref 39–117)
BUN: 18 mg/dL (ref 6–23)
Creat: 1.38 mg/dL — ABNORMAL HIGH (ref 0.50–1.35)

## 2012-11-04 LAB — CBC WITH DIFFERENTIAL/PLATELET
Basophils Absolute: 0 10*3/uL (ref 0.0–0.1)
Basophils Relative: 0 % (ref 0–1)
Eosinophils Relative: 2 % (ref 0–5)
HCT: 42.2 % (ref 39.0–52.0)
Lymphocytes Relative: 43 % (ref 12–46)
MCHC: 35.1 g/dL (ref 30.0–36.0)
MCV: 93.2 fL (ref 78.0–100.0)
Monocytes Absolute: 0.6 10*3/uL (ref 0.1–1.0)
Neutro Abs: 3 10*3/uL (ref 1.7–7.7)
Platelets: 309 10*3/uL (ref 150–400)
RDW: 12.7 % (ref 11.5–15.5)
WBC: 6.7 10*3/uL (ref 4.0–10.5)

## 2012-11-04 LAB — RPR

## 2012-11-04 LAB — LIPID PANEL
HDL: 29 mg/dL — ABNORMAL LOW (ref >39)
LDL Cholesterol: 100 mg/dL — ABNORMAL HIGH (ref 0–99)
Total CHOL/HDL Ratio: 6.1 Ratio
Triglycerides: 235 mg/dL — ABNORMAL HIGH (ref <150)

## 2012-11-04 LAB — T-HELPER CELL (CD4) - (RCID CLINIC ONLY)
CD4 % Helper T Cell: 26 % — ABNORMAL LOW (ref 33–55)
CD4 T Cell Abs: 780 /uL (ref 400–2700)

## 2012-11-07 ENCOUNTER — Other Ambulatory Visit: Payer: Self-pay | Admitting: *Deleted

## 2012-11-07 DIAGNOSIS — B029 Zoster without complications: Secondary | ICD-10-CM

## 2012-11-07 DIAGNOSIS — I10 Essential (primary) hypertension: Secondary | ICD-10-CM

## 2012-11-07 DIAGNOSIS — B20 Human immunodeficiency virus [HIV] disease: Secondary | ICD-10-CM

## 2012-11-07 LAB — HIV-1 RNA QUANT-NO REFLEX-BLD
HIV 1 RNA Quant: <20 copies/mL (ref <20)
HIV-1 RNA Quant, Log: <1.3 {Log} (ref <1.30)

## 2012-11-07 MED ORDER — ACYCLOVIR 200 MG PO CAPS: 200.0000 mg | ORAL_CAPSULE | Freq: Every day | ORAL | Status: DC

## 2012-11-07 MED ORDER — LISINOPRIL 20 MG PO TABS: 20.0000 mg | ORAL_TABLET | Freq: Every day | ORAL | Status: DC

## 2012-11-07 MED ORDER — EFAVIRENZ-EMTRICITAB-TENOFOVIR 600-200-300 MG PO TABS: 1.0000 | ORAL_TABLET | Freq: Every day | ORAL | Status: DC

## 2012-11-07 MED ORDER — HYDROCHLOROTHIAZIDE 25 MG PO TABS: 25.0000 mg | ORAL_TABLET | Freq: Every day | ORAL | Status: DC

## 2012-11-21 ENCOUNTER — Ambulatory Visit (INDEPENDENT_AMBULATORY_CARE_PROVIDER_SITE_OTHER): Payer: Self-pay | Admitting: Infectious Disease

## 2012-11-21 ENCOUNTER — Encounter: Payer: Self-pay | Admitting: Infectious Disease

## 2012-11-21 VITALS — BP 122/79 | HR 78 | Temp 98.3°F | Wt 210.0 lb

## 2012-11-21 DIAGNOSIS — I10 Essential (primary) hypertension: Secondary | ICD-10-CM

## 2012-11-21 DIAGNOSIS — Z23 Encounter for immunization: Secondary | ICD-10-CM

## 2012-11-21 DIAGNOSIS — B20 Human immunodeficiency virus [HIV] disease: Secondary | ICD-10-CM

## 2012-11-21 DIAGNOSIS — R42 Dizziness and giddiness: Secondary | ICD-10-CM

## 2012-11-21 MED ORDER — VALACYCLOVIR HCL 500 MG PO TABS: 500.0000 mg | ORAL_TABLET | Freq: Every day | ORAL | Status: DC

## 2012-11-21 MED ORDER — LISINOPRIL-HYDROCHLOROTHIAZIDE 20-25 MG PO TABS: 1.0000 | ORAL_TABLET | Freq: Every day | ORAL | Status: DC

## 2012-11-21 NOTE — Progress Notes (Signed)
Subjective:    Patient ID: Jonathan Hendricks, male    DOB: 05/29/1960, 52 y.o.   MRN: [id number]  HPI   52 year old with HIV who has managed to control his HIV with Atripla.   The last time we saw him he was having some depressive symptoms. He also was having some symptoms or worrisome for possible orthostatic dizziness. These have resolved and is continued on his antihypertensive medications. I do believe he has had further surgery or attention to his hernia but we did not discuss that during his visit today.  He admits to missing his doses of atripla approximately 2 times a month typically due to him forgetting to take it at bedtime. He drives if he can do except take it at night. He states that he takes trouble I is awake he does feel "drunk". We again reviewed other single tablet regimens including complera with a meal STRIBILD and Triumeq but he still wants to remain on his current regimen.   Review of Systems  Constitutional: Negative for fever, chills, diaphoresis, activity change, appetite change, fatigue and unexpected weight change.  HENT: Negative for congestion, sore throat, rhinorrhea, sneezing, trouble swallowing, postnasal drip and sinus pressure.   Eyes: Negative for photophobia and visual disturbance.  Respiratory: Negative for cough, chest tightness, shortness of breath, wheezing and stridor.   Cardiovascular: Negative for chest pain, palpitations and leg swelling.  Gastrointestinal: Negative for nausea, vomiting, abdominal pain, diarrhea, constipation, blood in stool, abdominal distention and anal bleeding.  Genitourinary: Negative for dysuria, hematuria, flank pain and difficulty urinating.  Musculoskeletal: Negative for myalgias, back pain, joint swelling, arthralgias and gait problem.  Skin: Negative for color change, pallor, rash and wound.  Neurological: Negative for dizziness, tremors, weakness, light-headedness and headaches.  Hematological: Negative for adenopathy.  Does not bruise/bleed easily.  Psychiatric/Behavioral: Negative for behavioral problems, confusion, sleep disturbance, dysphoric mood, decreased concentration and agitation. The patient is not nervous/anxious.        Objective:   Physical Exam  Constitutional: He is oriented to person, place, and time. He appears well-developed and well-nourished. No distress.  HENT:  Head: Normocephalic and atraumatic.  Nose: Mucosal edema and rhinorrhea present. Right sinus exhibits frontal sinus tenderness. Left sinus exhibits frontal sinus tenderness.  Mouth/Throat: Oropharynx is clear and moist. No oropharyngeal exudate.  Eyes: Conjunctivae and EOM are normal. Pupils are equal, round, and reactive to light. No scleral icterus.  Neck: Normal range of motion. Neck supple. No JVD present.  Cardiovascular: Normal rate, regular rhythm and normal heart sounds.  Exam reveals no gallop and no friction rub.   No murmur heard. Pulmonary/Chest: Effort normal and breath sounds normal. No respiratory distress. He has no wheezes. He has no rales. He exhibits no tenderness.  Abdominal: He exhibits no distension. A hernia is present.    Musculoskeletal: He exhibits no edema and no tenderness.  Lymphadenopathy:    He has no cervical adenopathy.  Neurological: He is alert and oriented to person, place, and time. He exhibits normal muscle tone. Coordination normal.  Skin: Skin is warm and dry. He is not diaphoretic. No erythema. No pallor.  Psychiatric: His behavior is normal. Judgment and thought content normal. He exhibits a depressed mood.          Assessment & Plan:   HIV: will continue to consider change to complera in future. He wishes to continue atripla for now since he is USED to it Encouraged him to try to NEVER miss doses.  I spent greater than 45 minutes with the patient including greater than 50% of time in face to face counsel of the patient and in coordination of their care.    Depression:  as above consider change off of atripla as above. He did not c/o the symptoms today.  HTN: Well controlled consolidate hydrochlorothiazide and lisinopril and one pill.  Dizziness and lightheadedness: Resolved.   Need for  flu influenza vaccination and flu vaccine given

## 2012-11-21 NOTE — Patient Instructions (Addendum)
The options to the ATRIPLA THAT WOULD NOT HAVE SYMPTOMS OF VIVID DREAMS OR FEELING DRUNK IF YOU WERE TO TAKE IT WHILE YOU ARE AWAKE  COMPLERA ONCE DAILY WITH 500 CALORIE MEAL  STRIBILD ONCE DAILY WITH FOOD  TRIUMEQ ONCE DAILY WITH OR WITHOUT FOOD

## 2013-01-06 ENCOUNTER — Other Ambulatory Visit (INDEPENDENT_AMBULATORY_CARE_PROVIDER_SITE_OTHER): Payer: Self-pay

## 2013-01-06 DIAGNOSIS — B20 Human immunodeficiency virus [HIV] disease: Secondary | ICD-10-CM

## 2013-01-06 DIAGNOSIS — Z113 Encounter for screening for infections with a predominantly sexual mode of transmission: Secondary | ICD-10-CM

## 2013-02-09 ENCOUNTER — Encounter: Payer: Self-pay | Admitting: Infectious Disease

## 2013-02-09 ENCOUNTER — Ambulatory Visit (INDEPENDENT_AMBULATORY_CARE_PROVIDER_SITE_OTHER): Payer: Self-pay | Admitting: Infectious Disease

## 2013-02-09 VITALS — BP 118/82 | HR 79 | Temp 98.1°F | Ht 72.0 in | Wt 216.0 lb

## 2013-02-09 DIAGNOSIS — Z113 Encounter for screening for infections with a predominantly sexual mode of transmission: Secondary | ICD-10-CM

## 2013-02-09 DIAGNOSIS — F329 Major depressive disorder, single episode, unspecified: Secondary | ICD-10-CM

## 2013-02-09 DIAGNOSIS — B20 Human immunodeficiency virus [HIV] disease: Secondary | ICD-10-CM

## 2013-02-09 DIAGNOSIS — R45851 Suicidal ideations: Secondary | ICD-10-CM

## 2013-02-09 DIAGNOSIS — R21 Rash and other nonspecific skin eruption: Secondary | ICD-10-CM

## 2013-02-09 DIAGNOSIS — B009 Herpesviral infection, unspecified: Secondary | ICD-10-CM

## 2013-02-09 DIAGNOSIS — I10 Essential (primary) hypertension: Secondary | ICD-10-CM

## 2013-02-09 LAB — COMPLETE METABOLIC PANEL WITH GFR
ALT: 102 U/L — ABNORMAL HIGH (ref 0–53)
Albumin: 4.4 g/dL (ref 3.5–5.2)
CO2: 29 mEq/L (ref 19–32)
Calcium: 9.6 mg/dL (ref 8.4–10.5)
Chloride: 98 mEq/L (ref 96–112)
Creat: 1.47 mg/dL — ABNORMAL HIGH (ref 0.50–1.35)
GFR, Est African American: 63 mL/min
Potassium: 4 mEq/L (ref 3.5–5.3)
Sodium: 136 mEq/L (ref 135–145)
Total Bilirubin: 0.4 mg/dL (ref 0.3–1.2)
Total Protein: 8.1 g/dL (ref 6.0–8.3)

## 2013-02-09 LAB — CBC WITH DIFFERENTIAL/PLATELET
Basophils Absolute: 0 10*3/uL (ref 0.0–0.1)
Eosinophils Absolute: 0.2 10*3/uL (ref 0.0–0.7)
Eosinophils Relative: 3 % (ref 0–5)
Lymphocytes Relative: 39 % (ref 12–46)
MCH: 33.1 pg (ref 26.0–34.0)
MCHC: 35.6 g/dL (ref 30.0–36.0)
MCV: 93.2 fL (ref 78.0–100.0)
Monocytes Absolute: 0.8 10*3/uL (ref 0.1–1.0)
Neutrophils Relative %: 49 % (ref 43–77)
Platelets: 354 10*3/uL (ref 150–400)
RDW: 13.1 % (ref 11.5–15.5)
WBC: 9.2 10*3/uL (ref 4.0–10.5)

## 2013-02-09 MED ORDER — TRIAMCINOLONE ACETONIDE 0.5 % EX OINT: 1.0000 "application " | TOPICAL_OINTMENT | Freq: Two times a day (BID) | CUTANEOUS | Status: DC

## 2013-02-09 MED ORDER — ESCITALOPRAM OXALATE 10 MG PO TABS: 5.0000 mg | ORAL_TABLET | Freq: Every day | ORAL | Status: DC
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
## 2013-02-10 LAB — HEPATITIS PANEL, ACUTE: Hep A IgM: NONREACTIVE

## 2013-02-13 LAB — HIV-1 RNA QUANT-NO REFLEX-BLD: HIV-1 RNA Quant, Log: <1.3 {Log} (ref <1.30)

## 2013-02-14 LAB — HLA B*5701: HLA-B*5701 w/rflx HLA-B High: NEGATIVE

## 2013-02-15 ENCOUNTER — Encounter: Payer: Self-pay | Admitting: Infectious Disease

## 2013-02-15 ENCOUNTER — Ambulatory Visit (INDEPENDENT_AMBULATORY_CARE_PROVIDER_SITE_OTHER): Payer: Self-pay | Admitting: Infectious Disease

## 2013-02-15 VITALS — BP 126/90 | HR 89 | Temp 97.2°F | Wt 215.0 lb

## 2013-02-15 DIAGNOSIS — R21 Rash and other nonspecific skin eruption: Secondary | ICD-10-CM | POA: Insufficient documentation

## 2013-02-15 DIAGNOSIS — B009 Herpesviral infection, unspecified: Secondary | ICD-10-CM

## 2013-02-15 DIAGNOSIS — F341 Dysthymic disorder: Secondary | ICD-10-CM

## 2013-02-15 DIAGNOSIS — R45851 Suicidal ideations: Secondary | ICD-10-CM

## 2013-02-15 DIAGNOSIS — I10 Essential (primary) hypertension: Secondary | ICD-10-CM

## 2013-02-15 DIAGNOSIS — B20 Human immunodeficiency virus [HIV] disease: Secondary | ICD-10-CM

## 2013-02-15 DIAGNOSIS — F418 Other specified anxiety disorders: Secondary | ICD-10-CM

## 2013-02-15 MED ORDER — FLUCONAZOLE 100 MG PO TABS: 100.0000 mg | ORAL_TABLET | Freq: Every day | ORAL | Status: DC

## 2013-02-15 NOTE — Progress Notes (Signed)
Subjective:    Patient ID: Jonathan Hendricks, male    DOB: Mar 11, 1960, 52 y.o.   MRN: [id number]  HPI  52 year old with HIV who has managed to control his HIV with Atripla.   He comes in today for followup  of multiple complaints.  First of all use had a diffuse rash which began in his right groin and which he thought was a "heat rash. He then developed additional lesions all in the ventral surfaces of his upper arms lower legs. Lesions involve palms of hands or soles of feet. He has no mucosal involvement. Lesions are intensely. It. They have progressed over the last 3 months. He has not started any new medications I had prescribed Valtrex in place via cycle here but did not get change from acyclovir to Valtrex. I've given him a prescription for combination lisinopril and hydrochlorothiazide but he was still taking the separate components. He is also still taking Atripla. Otherwise he is on no new medications. He has a dog at home the dog only stays inside. He has not been exposed to poison ivy. He went and changed all of his linens and clothes in hopes to improve this.  He has had no relief. A few weeks ago he was concerned he might have syphilis and came in had an RPR done here which was negative.  He states he has not any new sexual partners her last several months.  We did repeat RPR and other HIV labs and there was nothing remarkable with labs.  I gave him rx for triamcinolone and this has actually made the rash esp in his groin MUCH worse suggestive of fungal infection.  We also dealt extensively with his depression and at that time passive uicidal ideation.   He continues to have  difficulty at work at times getting into confrontations with coworkers and did so yet again on Monday.  Marland Kitchen He denies having active suicidal ideation. He did contract for safety with [place] again today.  I had also started him on SSRI low dose at hte last visit.  We had another extensive discusssion re change  OFF of the EFV based STR with my preference being for either Complera or TRIUMEQ.        Review of Systems  Constitutional: Positive for fatigue. Negative for fever, chills, diaphoresis, activity change, appetite change and unexpected weight change.  HENT: Negative for congestion, rhinorrhea, sinus pressure, sneezing, sore throat and trouble swallowing.   Eyes: Negative for photophobia and visual disturbance.  Respiratory: Negative for cough, chest tightness, shortness of breath, wheezing and stridor.   Cardiovascular: Negative for chest pain, palpitations and leg swelling.  Gastrointestinal: Negative for nausea, vomiting, abdominal pain, diarrhea, constipation, blood in stool, abdominal distention and anal bleeding.  Genitourinary: Negative for dysuria, hematuria, flank pain and difficulty urinating.  Musculoskeletal: Negative for arthralgias, back pain, gait problem, joint swelling and myalgias.  Skin: Positive for rash. Negative for color change, pallor and wound.  Neurological: Negative for dizziness, tremors, weakness and light-headedness.  Hematological: Negative for adenopathy. Does not bruise/bleed easily.  Psychiatric/Behavioral: Positive for suicidal ideas, sleep disturbance, dysphoric mood, decreased concentration and agitation. Negative for hallucinations, behavioral problems, confusion and self-injury. The patient is nervous/anxious and is hyperactive.        Objective:   Physical Exam  Constitutional: He is oriented to person, place, and time. He appears well-developed and well-nourished. No distress.  HENT:  Head: Normocephalic and atraumatic.  Mouth/Throat: Oropharynx is clear and  moist. No oropharyngeal exudate.  Eyes: Conjunctivae and EOM are normal. No scleral icterus.  Neck: Normal range of motion. Neck supple.  Cardiovascular: Normal rate, regular rhythm and normal heart sounds.   Pulmonary/Chest: Effort normal. No respiratory distress. He has no wheezes.    Abdominal: Soft. He exhibits no distension. There is no tenderness. There is no rebound.  Musculoskeletal: He exhibits no edema and no tenderness.  Neurological: He is alert and oriented to person, place, and time. He exhibits normal muscle tone. Coordination normal.  Skin: Skin is warm and dry. Rash noted. He is not diaphoretic. There is erythema. No pallor.     Psychiatric: Judgment normal. His mood appears anxious. His affect is not angry and not inappropriate. His speech is not rapid and/or pressured, not delayed, not tangential and not slurred. He is slowed. He is not agitated, not aggressive and not hyperactive. Thought content is not paranoid and not delusional. Cognition and memory are not impaired. He exhibits a depressed mood. He expresses no homicidal ideation. He expresses no suicidal plans. He is communicative.          Assessment & Plan:   Rash: It worsened with steroids which is highly suggestive of fungal infection esp also considering original source in groin  --will give him 14 days of oral fluconazole and topical lotrimin  I spent greater than 25 minutes with the patient including greater than 50% of time in face to face counsel of the patient and in coordination of their care.    Depression with passive suicidal thoughts: seems slighlty improved but he still is having troubles at worse. Needs to be in counselling, with escalation of SSRI and needs to change ARV but he still wants to consider this  HIV: I very much would like to get him off the Atripla being placed on a different drug but at low side effect profile and low CNS affects such as TRIUMEQ or COMPLERA rather than currently available STRIBILD given his CR has creeped up a bit and there would be higher SE and likely worsening #s for GFR  HTN: Well controlled on hydrochlorothiazide and lisinopril . 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 I don't think the acyclovir is  causing his rash.

## 2013-02-15 NOTE — Patient Instructions (Signed)
   Please try the LOTRIMIN (clotrimazole) cream and apply to rash four times daily  Take the fluconazole every day for 2 weeks (there is a refill on this one)  The other STR's I would strongly consider would be :  Complera (with 400 calories of food, no Proton pump inhibitors  Or   Triumeq with or without food

## 2013-03-31 ENCOUNTER — Ambulatory Visit: Payer: Self-pay

## 2013-04-06 ENCOUNTER — Ambulatory Visit: Payer: Self-pay

## 2013-04-11 ENCOUNTER — Ambulatory Visit: Payer: Self-pay

## 2013-04-11 ENCOUNTER — Ambulatory Visit: Payer: Self-pay | Admitting: Infectious Disease

## 2013-04-18 ENCOUNTER — Telehealth: Payer: Self-pay | Admitting: *Deleted

## 2013-04-18 NOTE — Telephone Encounter (Signed)
Called the [employer].  Mr. Jonathan Hendricks was approved for $4000 grant.  He is covered until 04-17-14

## 2013-05-01 ENCOUNTER — Encounter: Payer: Self-pay | Admitting: Family Medicine

## 2013-05-01 ENCOUNTER — Ambulatory Visit (INDEPENDENT_AMBULATORY_CARE_PROVIDER_SITE_OTHER): Payer: 59 | Admitting: Family Medicine

## 2013-05-01 VITALS — BP 118/82 | HR 86 | Temp 98.1°F | Ht 70.25 in | Wt 216.5 lb

## 2013-05-01 DIAGNOSIS — R21 Rash and other nonspecific skin eruption: Secondary | ICD-10-CM

## 2013-05-01 DIAGNOSIS — H6120 Impacted cerumen, unspecified ear: Secondary | ICD-10-CM

## 2013-05-01 DIAGNOSIS — I10 Essential (primary) hypertension: Secondary | ICD-10-CM

## 2013-05-01 DIAGNOSIS — B20 Human immunodeficiency virus [HIV] disease: Secondary | ICD-10-CM

## 2013-05-01 DIAGNOSIS — A539 Syphilis, unspecified: Secondary | ICD-10-CM

## 2013-05-01 NOTE — Assessment & Plan Note (Signed)
Followed by ID.  Referral placed and will make appt for him to see Dr. Daiva EvesVan Dam again later this month.

## 2013-05-01 NOTE — Assessment & Plan Note (Signed)
Well controlled. No changes. 

## 2013-05-01 NOTE — Patient Instructions (Signed)
Nice to meet you. Please stop by to see Jonathan Hendricks on your way out to set up your referrals.  Try Debrox over the counter.

## 2013-05-01 NOTE — Progress Notes (Signed)
Subjective:   Patient ID: Jonathan Hendricks, male    DOB: 1960-07-20, 53 y.o.   MRN: [id number]  Jonathan Gammonhomas F Lorimer is a pleasant 53 y.o. year old male who presents to clinic today with Establish Care and Rash  on 05/01/2013  HPI: Has been followed by ID (Dr. Daiva EvesVan Dam) for years for HIV and other chronic health issues. On Atripla.  Last saw Dr. Daiva EvesVan Dam in 01/2013.  He is here because his new insurance states that he needs a PCP to refer him to specialists, including ID.  Rash- has been worsening for over 8 months.  Initially started in his right groin, and has now spread to inner surfaces of his arms and legs, tops of his feet.  Mostly itchy but rash on feet is painful.  No new medications.  Kenalog made it worse and diflucan did not help.  Had an appt with derm but it was cancelled due to this insurance issue.  Left ear pain- over two months- feels there "is a bug in there."  Painful and can feel it crawling at times.  Also can feel a bump in front of his ear.  Having more headaches although he cannot describe them well to me. Not associated with photophobia, nausea or vomiting.  Patient Active Problem List   Diagnosis Date Noted  . Cerumen impaction 05/01/2013  . Rash and nonspecific skin eruption 02/15/2013  . Abdominal hernia 12/28/2011  . Sinusitis, bacterial 12/28/2011  . HTN (hypertension) 05/18/2011  . CERUMEN IMPACTION, LEFT 12/20/2009  . SKIN RASH 12/20/2009  . ACETONURIA 05/02/2009  . CLUSTER HEADACHE 09/13/2008  . DENTAL CARIES 09/13/2008  . ACUTE BRONCHITIS 08/23/2008  . PAIN IN JOINT, MULTIPLE SITES 08/23/2008  . DIARRHEA 08/23/2008  . INSOMNIA 04/26/2008  . FLANK PAIN, RIGHT 04/26/2008  . Human immunodeficiency virus (HIV) disease 04/09/2008  . HERPES SIMPLEX WITHOUT MENTION OF COMPLICATION 04/09/2008  . SYPHILIS 04/09/2008  . RENAL CALCULUS, HX OF 04/09/2008   Past Medical History  Diagnosis Date  . IBD (inflammatory bowel disease)   . Constipation   . Rectal pain    . HIV disease   . Depression   . Hypertension    History reviewed. No pertinent past surgical history. History  Substance Use Topics  . Smoking status: Never Smoker   . Smokeless tobacco: Never Used  . Alcohol Use: Yes   Family History  Problem Relation Age of Onset  . Heart disease Mother    No Known Allergies Current Outpatient Prescriptions on File Prior to Visit  Medication Sig Dispense Refill  . efavirenz-emtricitabine-tenofovir (ATRIPLA) 600-200-300 MG per tablet Take 1 tablet by mouth at bedtime.  30 tablet  11  . escitalopram (LEXAPRO) 10 MG tablet Take 0.5 tablets (5 mg total) by mouth daily.  30 tablet  4  . lisinopril-hydrochlorothiazide (PRINZIDE,ZESTORETIC) 20-25 MG per tablet Take 1 tablet by mouth daily.  30 tablet  11  . OVER THE COUNTER MEDICATION otc allergy eye gtts      . valACYclovir (VALTREX) 500 MG tablet Take 1 tablet (500 mg total) by mouth daily.  30 tablet  11   No current facility-administered medications on file prior to visit.   The PMH, PSH, Social History, Family History, Medications, and allergies [facility], and have been updated if relevant.  Review of Systems No hearing loss No joint pain No fevers No blurred vision No CP or SOB    Objective:    BP 118/82  Pulse  86  Temp(Src) 98.1 F (36.7 C) (Oral)  Ht 5' 10.25" (1.784 m)  Wt 216 lb 8 oz (98.204 kg)  BMI 30.86 kg/m2  SpO2 97%   Physical Exam  Nursing note and vitals reviewed. Constitutional: He appears well-developed and well-nourished. No distress.  HENT:  Head: Normocephalic.  Right Ear: No mastoid tenderness.  Left Ear: No mastoid tenderness.  Ears:  Eyes: Pupils are equal, round, and reactive to light.  Skin:     Psychiatric: He has a normal mood and affect. His behavior is normal. Judgment normal. His speech is tangential. Cognition and memory are normal.          Assessment & Plan:   SKIN RASH - Plan: Ambulatory referral to  Dermatology  Human immunodeficiency virus (HIV) disease - Plan: Ambulatory referral to Infectious Disease  SYPHILIS - Plan: Ambulatory referral to Infectious Disease No Follow-up on file.

## 2013-05-01 NOTE — Assessment & Plan Note (Signed)
Agree that he needs to see dermatology.  I suspected it could be fungal since worsened by steroids but diflucan was ineffective. Referral placed.

## 2013-05-01 NOTE — Assessment & Plan Note (Signed)
Ceruminosis is noted.  Wax is removed by syringing and manual debridement. Instructions for home care to prevent wax buildup are given.  Ear pain resolved once cerumen removed.

## 2013-05-01 NOTE — Progress Notes (Signed)
Pre visit review using our clinic review tool, if applicable. No additional management support is needed unless otherwise documented below in the visit note. 

## 2013-05-02 ENCOUNTER — Telehealth: Payer: Self-pay | Admitting: Family Medicine

## 2013-05-02 NOTE — Telephone Encounter (Signed)
Relevant patient education assigned to patient using Emmi. ° °

## 2013-05-15 ENCOUNTER — Ambulatory Visit: Payer: Self-pay | Admitting: Infectious Disease

## 2013-11-30 ENCOUNTER — Other Ambulatory Visit: Payer: Self-pay | Admitting: *Deleted

## 2013-11-30 DIAGNOSIS — B20 Human immunodeficiency virus [HIV] disease: Secondary | ICD-10-CM

## 2013-11-30 MED ORDER — EFAVIRENZ-EMTRICITAB-TENOFOVIR 600-200-300 MG PO TABS: 1.0000 | ORAL_TABLET | Freq: Every day | ORAL | Status: DC

## 2013-12-04 ENCOUNTER — Ambulatory Visit: Payer: 59

## 2013-12-04 ENCOUNTER — Ambulatory Visit (INDEPENDENT_AMBULATORY_CARE_PROVIDER_SITE_OTHER): Payer: 59 | Admitting: Infectious Disease

## 2013-12-04 ENCOUNTER — Encounter: Payer: Self-pay | Admitting: Infectious Disease

## 2013-12-04 VITALS — BP 120/81 | HR 79 | Temp 98.4°F | Wt 215.0 lb

## 2013-12-04 DIAGNOSIS — F32A Depression, unspecified: Secondary | ICD-10-CM

## 2013-12-04 DIAGNOSIS — I10 Essential (primary) hypertension: Secondary | ICD-10-CM

## 2013-12-04 DIAGNOSIS — R454 Irritability and anger: Secondary | ICD-10-CM

## 2013-12-04 DIAGNOSIS — Z113 Encounter for screening for infections with a predominantly sexual mode of transmission: Secondary | ICD-10-CM

## 2013-12-04 DIAGNOSIS — Z23 Encounter for immunization: Secondary | ICD-10-CM

## 2013-12-04 DIAGNOSIS — F329 Major depressive disorder, single episode, unspecified: Secondary | ICD-10-CM

## 2013-12-04 DIAGNOSIS — B20 Human immunodeficiency virus [HIV] disease: Secondary | ICD-10-CM

## 2013-12-04 NOTE — Progress Notes (Signed)
Subjective:    Patient ID: Jonathan Hendricks, male    DOB: 1960/08/29, 53 y.o.   MRN: [id number]  HPI   53 year old with HIV who has managed to control his HIV with Atripla.   He is [place] into an [employer] [employer] [place] but now has lost his job due to problems with temper at work.   We had previously had extensive discussions re changing him off of the Atripla due to his problems with mood but he says he is not in the mood to discuss today.  He has been out of Atripla for 5 days.          Review of Systems  Constitutional: Positive for fatigue. Negative for fever, chills, diaphoresis, activity change, appetite change and unexpected weight change.  HENT: Negative for congestion, rhinorrhea, sinus pressure, sneezing, sore throat and trouble swallowing.   Eyes: Negative for photophobia and visual disturbance.  Respiratory: Negative for cough, chest tightness, shortness of breath, wheezing and stridor.   Cardiovascular: Negative for chest pain, palpitations and leg swelling.  Gastrointestinal: Negative for nausea, vomiting, abdominal pain, diarrhea, constipation, blood in stool, abdominal distention and anal bleeding.  Genitourinary: Negative for dysuria, hematuria, flank pain and difficulty urinating.  Musculoskeletal: Negative for arthralgias, back pain, gait problem, joint swelling and myalgias.  Skin: Positive for rash. Negative for color change, pallor and wound.  Neurological: Negative for dizziness, tremors, weakness and light-headedness.  Hematological: Negative for adenopathy. Does not bruise/bleed easily.  Psychiatric/Behavioral: Positive for sleep disturbance, dysphoric mood, decreased concentration and agitation. Negative for suicidal ideas, hallucinations, behavioral problems, confusion and self-injury. The patient is nervous/anxious and is hyperactive.        Objective:   Physical Exam  Constitutional: He is oriented to person, place, and time. He appears  well-developed and well-nourished. No distress.  HENT:  Head: Normocephalic and atraumatic.  Mouth/Throat: Oropharynx is clear and moist. No oropharyngeal exudate.  Eyes: Conjunctivae and EOM are normal. No scleral icterus.  Neck: Normal range of motion. Neck supple.  Cardiovascular: Normal rate, regular rhythm and normal heart sounds.   Pulmonary/Chest: Effort normal. No respiratory distress. He has no wheezes.  Abdominal: Soft. He exhibits no distension. There is no tenderness. There is no rebound.  Musculoskeletal: He exhibits no edema and no tenderness.  Neurological: He is alert and oriented to person, place, and time. He exhibits normal muscle tone. Coordination normal.  Skin: Skin is warm and dry. Rash noted. He is not diaphoretic. No pallor.  Psychiatric: Judgment normal. His mood appears anxious. His affect is not angry and not inappropriate. His speech is not rapid and/or pressured, not delayed, not tangential and not slurred. He is slowed. He is not agitated, not aggressive and not hyperactive. Thought content is not paranoid and not delusional. Cognition and memory are not impaired. He exhibits a depressed mood. He expresses no homicidal ideation. He expresses no suicidal plans. He is communicative.          Assessment & Plan:   HIV: I very much would like to get him off the Atripla being placed on a different drug but at low side effect profile and low CNS affects such as TRIUMEQ or COMPLERA  But he still does not want to make the switch. We had extensive discussion re risk behaviors for HIV (he is not engaged in any at present) but in terms of what is  Being said [place]. Had extensive discussion re hiv transmission and what undetectable  means, reservoir. I spent greater than 40 minutes with the patient including greater than 50% of time in face to face counsel of the patient and in coordination of their care.  I worry about him losing insurance. If so he will need  ADAP   Depression no  passive suicidal thoughts:but very unhappy given recent issues, loss of job etc.   HTN: Well controlled on hydrochlorothiazide and lisinopril .  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 I don't think the acyclovir is causing his rash.

## 2013-12-05 LAB — CBC WITH DIFFERENTIAL/PLATELET
Basophils Absolute: 0.1 10*3/uL (ref 0.0–0.1)
Basophils Relative: 1 % (ref 0–1)
Eosinophils Absolute: 0.2 10*3/uL (ref 0.0–0.7)
Eosinophils Relative: 2 % (ref 0–5)
HCT: 44.3 % (ref 39.0–52.0)
Hemoglobin: 15.7 g/dL (ref 13.0–17.0)
LYMPHS PCT: 36 % (ref 12–46)
Lymphs Abs: 3.3 10*3/uL (ref 0.7–4.0)
MCH: 33.7 pg (ref 26.0–34.0)
MCHC: 35.4 g/dL (ref 30.0–36.0)
MCV: 95.1 fL (ref 78.0–100.0)
Monocytes Absolute: 0.9 10*3/uL (ref 0.1–1.0)
Monocytes Relative: 10 % (ref 3–12)
NEUTROS ABS: 4.6 10*3/uL (ref 1.7–7.7)
NEUTROS PCT: 51 % (ref 43–77)
Platelets: 352 10*3/uL (ref 150–400)
RBC: 4.66 MIL/uL (ref 4.22–5.81)
RDW: 13.2 % (ref 11.5–15.5)
WBC: 9.1 10*3/uL (ref 4.0–10.5)

## 2013-12-05 LAB — COMPLETE METABOLIC PANEL WITH GFR
ALT: 72 U/L — ABNORMAL HIGH (ref 0–53)
AST: 35 U/L (ref 0–37)
Albumin: 4.2 g/dL (ref 3.5–5.2)
Alkaline Phosphatase: 94 U/L (ref 39–117)
BUN: 13 mg/dL (ref 6–23)
CO2: 26 mEq/L (ref 19–32)
Calcium: 9.5 mg/dL (ref 8.4–10.5)
Chloride: 100 mEq/L (ref 96–112)
Creat: 1.4 mg/dL — ABNORMAL HIGH (ref 0.50–1.35)
GFR, Est African American: 66 mL/min
GFR, Est Non African American: 57 mL/min — ABNORMAL LOW
Glucose, Bld: 89 mg/dL (ref 70–99)
Potassium: 4.1 mEq/L (ref 3.5–5.3)
Sodium: 137 mEq/L (ref 135–145)
Total Bilirubin: 0.6 mg/dL (ref 0.2–1.2)
Total Protein: 7.9 g/dL (ref 6.0–8.3)

## 2013-12-05 LAB — RPR

## 2013-12-05 LAB — URINE CYTOLOGY ANCILLARY ONLY
Chlamydia: NEGATIVE
NEISSERIA GONORRHEA: NEGATIVE

## 2013-12-05 LAB — HIV-1 RNA ULTRAQUANT REFLEX TO GENTYP+

## 2013-12-05 LAB — MICROALBUMIN / CREATININE URINE RATIO
Creatinine, Urine: 203.3 mg/dL
Microalb Creat Ratio: 24.6 mg/g (ref 0.0–30.0)
Microalb, Ur: 5 mg/dL — ABNORMAL HIGH (ref <2.0)

## 2013-12-05 LAB — T-HELPER CELL (CD4) - (RCID CLINIC ONLY)
CD4 % Helper T Cell: 28 % — ABNORMAL LOW (ref 33–55)
CD4 T Cell Abs: 970 /uL (ref 400–2700)

## 2013-12-06 ENCOUNTER — Other Ambulatory Visit: Payer: Self-pay | Admitting: *Deleted

## 2013-12-06 DIAGNOSIS — I10 Essential (primary) hypertension: Secondary | ICD-10-CM

## 2013-12-06 DIAGNOSIS — B009 Herpesviral infection, unspecified: Secondary | ICD-10-CM

## 2013-12-06 MED ORDER — VALACYCLOVIR HCL 500 MG PO TABS: 500.0000 mg | ORAL_TABLET | Freq: Every day | ORAL | Status: DC

## 2013-12-06 MED ORDER — LISINOPRIL-HYDROCHLOROTHIAZIDE 20-25 MG PO TABS: 1.0000 | ORAL_TABLET | Freq: Every day | ORAL | Status: DC

## 2013-12-28 ENCOUNTER — Ambulatory Visit: Payer: 59

## 2014-01-01 ENCOUNTER — Telehealth: Payer: Self-pay | Admitting: *Deleted

## 2014-01-01 ENCOUNTER — Other Ambulatory Visit: Payer: Self-pay | Admitting: Infectious Disease

## 2014-01-01 DIAGNOSIS — B20 Human immunodeficiency virus [HIV] disease: Secondary | ICD-10-CM

## 2014-01-01 DIAGNOSIS — Z21 Asymptomatic human immunodeficiency virus [HIV] infection status: Secondary | ICD-10-CM

## 2014-01-01 MED ORDER — EFAVIRENZ-EMTRICITAB-TENOFOVIR 600-200-300 MG PO TABS: 1.0000 | ORAL_TABLET | Freq: Every day | ORAL | Status: DC

## 2014-01-01 NOTE — Telephone Encounter (Signed)
Having difficulty obtaining Atripla rx.  Requesting assistance.  Phoned in refills on Atripla to [facility].  Left message for pt that he should be receiving his medication tomorrow from [facility].  [facility] near future.  Pt knows to call [facility] to sign up for ADAP and RW prior to his health insurance stopping to avoid running out of medication.

## 2014-01-11 ENCOUNTER — Telehealth: Payer: Self-pay | Admitting: *Deleted

## 2014-01-11 ENCOUNTER — Other Ambulatory Visit: Payer: Self-pay | Admitting: *Deleted

## 2014-01-11 DIAGNOSIS — B009 Herpesviral infection, unspecified: Secondary | ICD-10-CM

## 2014-01-11 DIAGNOSIS — I10 Essential (primary) hypertension: Secondary | ICD-10-CM

## 2014-01-11 MED ORDER — VALACYCLOVIR HCL 500 MG PO TABS: 500.0000 mg | ORAL_TABLET | Freq: Every day | ORAL | Status: DC

## 2014-01-11 MED ORDER — LISINOPRIL-HYDROCHLOROTHIAZIDE 20-25 MG PO TABS: 1.0000 | ORAL_TABLET | Freq: Every day | ORAL | Status: DC

## 2014-01-11 NOTE — Telephone Encounter (Signed)
Patient called and asked if we could refill his Lexapro. Advised the patient we have not given him that since 01/2013 with 4 refills and not sure we are filling that for him. He advised just ask the doctor and call him back as he does not have another doctor and only takes the medication when he gets very depressed. Advised him will ask and call him back once I get a response.

## 2014-01-11 NOTE — Telephone Encounter (Signed)
Fine to fill the lexapro but IS HE FILLING HIS ATRIPLA HIS PHARMACY SENT NOTE SAYING HE WAS 7 DAYS LATE!!

## 2014-01-12 ENCOUNTER — Other Ambulatory Visit: Payer: Self-pay | Admitting: *Deleted

## 2014-01-12 MED ORDER — ESCITALOPRAM OXALATE 10 MG PO TABS: 5.0000 mg | ORAL_TABLET | Freq: Every day | ORAL | Status: DC

## 2014-01-12 NOTE — Telephone Encounter (Signed)
Patient is aware that the Rx has been sent in to his pharmacy.

## 2014-02-26 ENCOUNTER — Other Ambulatory Visit: Payer: Self-pay | Admitting: *Deleted

## 2014-02-26 DIAGNOSIS — B20 Human immunodeficiency virus [HIV] disease: Secondary | ICD-10-CM

## 2014-02-26 DIAGNOSIS — Z21 Asymptomatic human immunodeficiency virus [HIV] infection status: Secondary | ICD-10-CM

## 2014-02-26 MED ORDER — EFAVIRENZ-EMTRICITAB-TENOFOVIR 600-200-300 MG PO TABS: 1.0000 | ORAL_TABLET | Freq: Every day | ORAL | Status: DC

## 2014-03-09 ENCOUNTER — Other Ambulatory Visit: Payer: Self-pay | Admitting: Infectious Disease

## 2014-03-15 ENCOUNTER — Other Ambulatory Visit: Payer: Self-pay | Admitting: *Deleted

## 2014-03-15 DIAGNOSIS — B20 Human immunodeficiency virus [HIV] disease: Secondary | ICD-10-CM

## 2014-03-15 MED ORDER — EFAVIRENZ-EMTRICITAB-TENOFOVIR 600-200-300 MG PO TABS: 1.0000 | ORAL_TABLET | Freq: Every day | ORAL | Status: DC

## 2014-05-03 ENCOUNTER — Telehealth: Payer: Self-pay | Admitting: *Deleted

## 2014-05-03 DIAGNOSIS — R300 Dysuria: Secondary | ICD-10-CM

## 2014-05-03 NOTE — Telephone Encounter (Signed)
Pt called asking for a lab visit, stating he "feels down, just not right; worried I might have a STD"; endorses burning at the tip of his penis.  Pt would like to come in for labs for syphilis, gonorrhea, chlamydia.  Patient states he is not sexually active, but would like to be and "doesn't want to infect anyone."  He wants to know his results before engaging in activity. Patient overdue for a visit/labs, gave appointment for 3/11 and 3/16, orders placed. Pt verbalized agreement. Andree CossHowell, Michelle M, RN

## 2014-05-04 ENCOUNTER — Other Ambulatory Visit (INDEPENDENT_AMBULATORY_CARE_PROVIDER_SITE_OTHER): Payer: Self-pay

## 2014-05-04 DIAGNOSIS — R300 Dysuria: Secondary | ICD-10-CM

## 2014-05-04 DIAGNOSIS — Z113 Encounter for screening for infections with a predominantly sexual mode of transmission: Secondary | ICD-10-CM

## 2014-05-04 DIAGNOSIS — B20 Human immunodeficiency virus [HIV] disease: Secondary | ICD-10-CM

## 2014-05-04 LAB — T-HELPER CELL (CD4) - (RCID CLINIC ONLY)
CD4 % Helper T Cell: 26 % — ABNORMAL LOW (ref 33–55)
CD4 T Cell Abs: 710 /uL (ref 400–2700)

## 2014-05-04 LAB — CBC WITH DIFFERENTIAL/PLATELET
BASOS PCT: 1 % (ref 0–1)
Basophils Absolute: 0.1 10*3/uL (ref 0.0–0.1)
EOS ABS: 0.2 10*3/uL (ref 0.0–0.7)
Eosinophils Relative: 2 % (ref 0–5)
HCT: 44.3 % (ref 39.0–52.0)
Hemoglobin: 15.4 g/dL (ref 13.0–17.0)
Lymphocytes Relative: 35 % (ref 12–46)
Lymphs Abs: 2.6 10*3/uL (ref 0.7–4.0)
MCH: 32.1 pg (ref 26.0–34.0)
MCHC: 34.8 g/dL (ref 30.0–36.0)
MCV: 92.3 fL (ref 78.0–100.0)
MPV: 9.8 fL (ref 8.6–12.4)
Monocytes Absolute: 0.7 10*3/uL (ref 0.1–1.0)
Monocytes Relative: 9 % (ref 3–12)
Neutro Abs: 4 10*3/uL (ref 1.7–7.7)
Neutrophils Relative %: 53 % (ref 43–77)
Platelets: 347 10*3/uL (ref 150–400)
RBC: 4.8 MIL/uL (ref 4.22–5.81)
RDW: 13.9 % (ref 11.5–15.5)
WBC: 7.5 10*3/uL (ref 4.0–10.5)

## 2014-05-04 LAB — RPR

## 2014-05-04 LAB — COMPLETE METABOLIC PANEL WITH GFR
ALK PHOS: 88 U/L (ref 39–117)
ALT: 44 U/L (ref 0–53)
AST: 25 U/L (ref 0–37)
Albumin: 4.1 g/dL (ref 3.5–5.2)
BILIRUBIN TOTAL: 0.5 mg/dL (ref 0.2–1.2)
BUN: 13 mg/dL (ref 6–23)
CO2: 24 meq/L (ref 19–32)
CREATININE: 1.55 mg/dL — AB (ref 0.50–1.35)
Calcium: 8.9 mg/dL (ref 8.4–10.5)
Chloride: 101 mEq/L (ref 96–112)
GFR, EST NON AFRICAN AMERICAN: 50 mL/min — AB
GFR, Est African American: 58 mL/min — ABNORMAL LOW
GLUCOSE: 117 mg/dL — AB (ref 70–99)
Potassium: 4.1 mEq/L (ref 3.5–5.3)
Sodium: 136 mEq/L (ref 135–145)
Total Protein: 7.9 g/dL (ref 6.0–8.3)

## 2014-05-05 LAB — URINALYSIS, ROUTINE W REFLEX MICROSCOPIC
BILIRUBIN URINE: NEGATIVE
Glucose, UA: NEGATIVE mg/dL
Hgb urine dipstick: NEGATIVE
Ketones, ur: NEGATIVE mg/dL
Leukocytes, UA: NEGATIVE
Nitrite: NEGATIVE
Protein, ur: NEGATIVE mg/dL
Specific Gravity, Urine: 1.015 (ref 1.005–1.030)
Urobilinogen, UA: 0.2 mg/dL (ref 0.0–1.0)
pH: 5.5 (ref 5.0–8.0)

## 2014-05-07 LAB — URINE CYTOLOGY ANCILLARY ONLY
Chlamydia: NEGATIVE
NEISSERIA GONORRHEA: NEGATIVE

## 2014-05-08 LAB — HIV-1 RNA QUANT-NO REFLEX-BLD
HIV 1 RNA Quant: <20 copies/mL (ref <20)
HIV-1 RNA Quant, Log: <1.3 {Log} (ref <1.30)

## 2014-05-09 ENCOUNTER — Encounter: Payer: Self-pay | Admitting: Infectious Disease

## 2014-05-09 ENCOUNTER — Ambulatory Visit (INDEPENDENT_AMBULATORY_CARE_PROVIDER_SITE_OTHER): Payer: Self-pay | Admitting: Infectious Disease

## 2014-05-09 VITALS — BP 123/86 | HR 78 | Temp 98.2°F | Wt 216.0 lb

## 2014-05-09 DIAGNOSIS — N183 Chronic kidney disease, stage 3 unspecified: Secondary | ICD-10-CM | POA: Insufficient documentation

## 2014-05-09 DIAGNOSIS — B20 Human immunodeficiency virus [HIV] disease: Secondary | ICD-10-CM

## 2014-05-09 DIAGNOSIS — I10 Essential (primary) hypertension: Secondary | ICD-10-CM

## 2014-05-09 DIAGNOSIS — N289 Disorder of kidney and ureter, unspecified: Secondary | ICD-10-CM

## 2014-05-09 DIAGNOSIS — K469 Unspecified abdominal hernia without obstruction or gangrene: Secondary | ICD-10-CM

## 2014-05-09 DIAGNOSIS — B009 Herpesviral infection, unspecified: Secondary | ICD-10-CM

## 2014-05-09 DIAGNOSIS — R300 Dysuria: Secondary | ICD-10-CM

## 2014-05-09 LAB — BASIC METABOLIC PANEL WITH GFR
BUN: 14 mg/dL (ref 6–23)
CALCIUM: 8.8 mg/dL (ref 8.4–10.5)
CHLORIDE: 100 meq/L (ref 96–112)
CO2: 26 meq/L (ref 19–32)
CREATININE: 1.47 mg/dL — AB (ref 0.50–1.35)
GFR, Est African American: 62 mL/min
GFR, Est Non African American: 54 mL/min — ABNORMAL LOW
Glucose, Bld: 81 mg/dL (ref 70–99)
Potassium: 4.1 mEq/L (ref 3.5–5.3)
SODIUM: 136 meq/L (ref 135–145)

## 2014-05-09 LAB — URINALYSIS, ROUTINE W REFLEX MICROSCOPIC
Bilirubin Urine: NEGATIVE
Glucose, UA: NEGATIVE mg/dL
Hgb urine dipstick: NEGATIVE
KETONES UR: NEGATIVE mg/dL
NITRITE: NEGATIVE
PH: 6 (ref 5.0–8.0)
Protein, ur: NEGATIVE mg/dL
SPECIFIC GRAVITY, URINE: 1.016 (ref 1.005–1.030)
Urobilinogen, UA: 1 mg/dL (ref 0.0–1.0)

## 2014-05-09 MED ORDER — LEVOFLOXACIN 500 MG PO TABS: 500.0000 mg | ORAL_TABLET | Freq: Every day | ORAL | Status: DC

## 2014-05-09 NOTE — Progress Notes (Signed)
Subjective:    Patient ID: Jonathan Hendricks, male    DOB: 08-12-1960, 54 y.o.   MRN: [id number]  HPI   54 year old with HIV who has managed to control his HIV with Atripla.   He is enrolled into an [employer] health care plan but now has lost his job due to problems with temper at work. He is now on ADAP  He does not want to change his ARV regimen but is having worsening renal fxn on repeat labs. He has had insertive anal sex and afterwards been suffering from dysuria but with negative GC and chlamydia and negative urine analysis.   His BP is well controlled on HCTZ/ACEI.     Review of Systems  Constitutional: Positive for fatigue. Negative for fever, chills, diaphoresis, activity change, appetite change and unexpected weight change.  HENT: Negative for congestion, rhinorrhea, sinus pressure, sneezing, sore throat and trouble swallowing.   Eyes: Negative for photophobia and visual disturbance.  Respiratory: Negative for cough, chest tightness, shortness of breath, wheezing and stridor.   Cardiovascular: Negative for chest pain, palpitations and leg swelling.  Gastrointestinal: Negative for nausea, vomiting, abdominal pain, diarrhea, constipation, blood in stool, abdominal distention and anal bleeding.  Genitourinary: Positive for dysuria. Negative for hematuria, flank pain and difficulty urinating.  Musculoskeletal: Negative for myalgias, back pain, joint swelling, arthralgias and gait problem.  Skin: Positive for rash. Negative for color change, pallor and wound.  Neurological: Negative for dizziness, tremors, weakness and light-headedness.  Hematological: Negative for adenopathy. Does not bruise/bleed easily.  Psychiatric/Behavioral: Positive for sleep disturbance, dysphoric mood and decreased concentration. Negative for suicidal ideas, hallucinations, behavioral problems, confusion, self-injury and agitation. The patient is nervous/anxious and is hyperactive.        Objective:   Physical Exam  Constitutional: He is oriented to person, place, and time. He appears well-developed and well-nourished. No distress.  HENT:  Head: Normocephalic and atraumatic.  Mouth/Throat: Oropharynx is clear and moist. No oropharyngeal exudate.  Eyes: Conjunctivae and EOM are normal. No scleral icterus.  Neck: Normal range of motion. Neck supple.  Cardiovascular: Normal rate, regular rhythm and normal heart sounds.   Pulmonary/Chest: Effort normal. No respiratory distress. He has no wheezes.  Abdominal: Soft. He exhibits no distension. There is no tenderness. There is no rebound.  Musculoskeletal: He exhibits no edema or tenderness.  Neurological: He is alert and oriented to person, place, and time. He exhibits normal muscle tone. Coordination normal.  Skin: Skin is warm and dry. He is not diaphoretic. No pallor.  Psychiatric: Judgment normal. His mood appears anxious. His affect is not angry and not inappropriate. His speech is not rapid and/or pressured, not delayed, not tangential and not slurred. He is slowed. He is not agitated, not aggressive and not hyperactive. Thought content is not paranoid and not delusional. Cognition and memory are not impaired. He exhibits a depressed mood. He expresses no homicidal ideation. He expresses no suicidal plans. He is communicative.          Assessment & Plan:   HIV: ultimately he will need to be on a non TDF regimen given worsening renal function. I spent greater than 40 minutes with the patient including greater than 50% of time in face to face counsel of the patient and in coordination of their car. We had extensive discussions about HIV, side risk of different drugs including components of Atripla  Dysuria: Sounds like he may have prostatitis. Will check UA and micro and culture and give  levaquin for 30d  ARF on CKD; could be due to dehydration, obstructive pathology due to prostatitis. Will check repeat BMP with urine lytes, UA today If  worse renal fxn will dc his HCTZ/ACEI and re-evaulate     Depression no  passive suicidal thoughts:but very unhappy given recent issues, loss of job etc.   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 I don't think the acyclovir is causing his rash.  Abdominal wall hernia: [facility] but would have to pay 12K himself out of pocket.

## 2014-05-10 LAB — URINE CULTURE
Colony Count: NO GROWTH
Organism ID, Bacteria: NO GROWTH

## 2014-05-10 LAB — URINALYSIS, MICROSCOPIC ONLY
Bacteria, UA: NONE SEEN
CASTS: NONE SEEN
Crystals: NONE SEEN
Squamous Epithelial / LPF: NONE SEEN

## 2014-05-10 LAB — SODIUM, URINE, RANDOM: SODIUM UR: 181 meq/L

## 2014-05-10 LAB — CREATININE, URINE, RANDOM: CREATININE, URINE: 206 mg/dL

## 2014-05-29 ENCOUNTER — Other Ambulatory Visit: Payer: Self-pay | Admitting: Infectious Disease

## 2014-06-28 ENCOUNTER — Other Ambulatory Visit: Payer: Self-pay | Admitting: Infectious Disease

## 2014-07-16 ENCOUNTER — Encounter: Payer: Self-pay | Admitting: Infectious Disease

## 2014-07-16 ENCOUNTER — Ambulatory Visit (INDEPENDENT_AMBULATORY_CARE_PROVIDER_SITE_OTHER): Payer: Self-pay | Admitting: Infectious Disease

## 2014-07-16 VITALS — Wt 219.0 lb

## 2014-07-16 DIAGNOSIS — R45851 Suicidal ideations: Secondary | ICD-10-CM

## 2014-07-16 DIAGNOSIS — F332 Major depressive disorder, recurrent severe without psychotic features: Secondary | ICD-10-CM

## 2014-07-16 DIAGNOSIS — B20 Human immunodeficiency virus [HIV] disease: Secondary | ICD-10-CM

## 2014-07-16 DIAGNOSIS — N62 Hypertrophy of breast: Secondary | ICD-10-CM

## 2014-07-16 DIAGNOSIS — N183 Chronic kidney disease, stage 3 unspecified: Secondary | ICD-10-CM

## 2014-07-16 DIAGNOSIS — I10 Essential (primary) hypertension: Secondary | ICD-10-CM

## 2014-07-16 DIAGNOSIS — A539 Syphilis, unspecified: Secondary | ICD-10-CM

## 2014-07-16 DIAGNOSIS — R4586 Emotional lability: Secondary | ICD-10-CM

## 2014-07-16 HISTORY — DX: Hypertrophy of breast: N62

## 2014-07-16 HISTORY — DX: Emotional lability: R45.86

## 2014-07-16 HISTORY — DX: Major depressive disorder, recurrent severe without psychotic features: F33.2

## 2014-07-16 NOTE — Patient Instructions (Signed)
  The regimens we discussed are:   TRIUMEQ one pill daily with or without food  ODEFSEY one pill daily with 400 calorie meal and  No PPI's, specific warning re other antacids and timing  GENVOYA on pill once  A day with food  Tivicay and Descovy = TWO pills once a day with or without food

## 2014-07-16 NOTE — Progress Notes (Signed)
Subjective:    Patient ID: Jonathan Hendricks, male    DOB: [birth year], 54 y.o.   MRN: [id number]  HPI   54 year old with HIV who has managed to control his HIV with Atripla.   He was enrolled into an Obamacare health care plan but now has lost his job due to problems with temper at work. He is now on ADAP  He comes into clinic today due to complaints of gynecomastia special his left breast which is painful.  I have told him that this side effect may indeed be due also to his Sustiva that is in the Atripla.  I have emphasized this is now at least the third reason why we need to change him off of Atripla.  The other 2 major reasons are:  Patient suffer some significant major depression with high degree of emotional lability which has caused him to lose his job and cause him to nearly be combative with individuals even while he is doing something as simple shopping.  He himself states that others of told him that he has bipolar disorder though what I'm hearing from him sounds like more problems with emotional lability than bipolar disorder per se.  He denies active suicidal ideation but endorses passive suicidal ideation several times worry. For these reasons we have try to get him off of his Atripla in the past but he was unwilling to come off the meds.  Today after discussing this issue and finally a third issue which is chronic kidney disease and the need to get him off of a TDF regimen onto a TAF or abacavir-based regimen he is willing to consider TRIUMEQ versus GENVOYA versus ODEFSEY versus TIVICAY and DESCOVY.       Review of Systems  Constitutional: Positive for fatigue. Negative for fever, chills, diaphoresis, activity change, appetite change and unexpected weight change.  HENT: Negative for congestion, rhinorrhea, sinus pressure, sneezing, sore throat and trouble swallowing.   Eyes: Negative for photophobia and visual disturbance.  Respiratory: Negative for cough, chest  tightness, shortness of breath, wheezing and stridor.   Cardiovascular: Negative for chest pain, palpitations and leg swelling.  Gastrointestinal: Negative for nausea, vomiting, abdominal pain, diarrhea, constipation, blood in stool, abdominal distention and anal bleeding.  Genitourinary: Negative for dysuria, hematuria, flank pain and difficulty urinating.  Musculoskeletal: Negative for myalgias, back pain, joint swelling, arthralgias and gait problem.  Skin: Positive for rash. Negative for color change, pallor and wound.  Neurological: Negative for dizziness, tremors, weakness and light-headedness.  Hematological: Negative for adenopathy. Does not bruise/bleed easily.  Psychiatric/Behavioral: Positive for suicidal ideas, behavioral problems, sleep disturbance, dysphoric mood, decreased concentration and agitation. Negative for hallucinations, confusion and self-injury. The patient is nervous/anxious and is hyperactive.        Objective:   Physical Exam  Constitutional: He is oriented to person, place, and time. He appears well-developed and well-nourished. No distress.  HENT:  Head: Normocephalic and atraumatic.  Mouth/Throat: Oropharynx is clear and moist. No oropharyngeal exudate.  Eyes: Conjunctivae and EOM are normal. No scleral icterus.  Neck: Normal range of motion. Neck supple.  Cardiovascular: Normal rate, regular rhythm and normal heart sounds.   Pulmonary/Chest: Effort normal. No respiratory distress. He has no wheezes.    Abdominal: Soft. He exhibits no distension. There is no tenderness. There is no rebound.  Musculoskeletal: He exhibits no edema or tenderness.  Neurological: He is alert and oriented to person, place, and time. He exhibits normal muscle tone. Coordination normal.  Skin: Skin is warm and dry. He is not diaphoretic. No pallor.  Psychiatric: Judgment normal. His mood appears anxious. His affect is not angry and not inappropriate. His speech is not rapid and/or  pressured, not delayed, not tangential and not slurred. He is slowed. He is not agitated, not aggressive and not hyperactive. Thought content is not paranoid and not delusional. Cognition and memory are not impaired. He exhibits a depressed mood. He expresses no homicidal ideation. He expresses no suicidal plans and no homicidal plans. He is communicative.          Assessment & Plan:   HIV:  Consider change to TRIUMEQ versus ODEFSEY versus GENVOYA versus TIVICAY and Descovy.  Severe depression with passive suicidal thought: Again this is the him team to reason and the umpteenth time that he clearly shows a should not be on Atripla and he needs to come off this regimen. I'm upping his Lexapro as well have had him meet with Jonathan Hendricks and performed a "warm and often brought Jonathan Hendricks to the room to talk with him about his problems and to schedule follow-up. I think he clearly needs some cognitive behavioral therapy to deal with his emotional lability in particular.  He may also need to see a psychiatrist who is more well attuned to fitting him with the best medications possibly to treat his depression  I spent greater than 40  minutes with the patient including greater than 50% of time in face to face counsel of the patient treatment options for his HIV better regimens for safety with regards to his mental health kidney bone health and extensive counseling with regards to his major depression passive suicidal ideation and emotional lability. He is conrtacted for safety with us and in coordination of their care.    ARF on CKD; again needs to come off of a Truvada based regimen  HTN: Reasonably well controlled on hydrochlorothiazide and lisinopril but could be

## 2014-07-16 NOTE — Progress Notes (Signed)
Patient ID: Jonathan Hendricks, male   DOB: Apr 12, 1960, 54 y.o.   MRN: [id number]   With regards to his gynecomastia: I like to get him off of Atripla as it likely is causing this. Will also check a testosterone level along with FSH LH TSH PSA and free testosterone how hormone and sex hormone binding globulin

## 2014-07-24 ENCOUNTER — Other Ambulatory Visit: Payer: Self-pay | Admitting: Infectious Disease

## 2014-07-24 ENCOUNTER — Other Ambulatory Visit: Payer: Self-pay | Admitting: Licensed Clinical Social Worker

## 2014-07-24 ENCOUNTER — Telehealth: Payer: Self-pay | Admitting: Licensed Clinical Social Worker

## 2014-07-24 NOTE — Telephone Encounter (Signed)
Yes [facility]. He may want a ViiV copay card if he has insurance. I am ok going to double his current dose of lexapro as well. He should meet with [name] as well

## 2014-07-24 NOTE — Telephone Encounter (Signed)
Patient called stating that he would like to change to Triumeq, and he was under the impression his antidepressant would be changed to a higher dose.

## 2014-07-25 ENCOUNTER — Other Ambulatory Visit: Payer: Self-pay | Admitting: Licensed Clinical Social Worker

## 2014-07-25 MED ORDER — ESCITALOPRAM OXALATE 10 MG PO TABS: 10.0000 mg | ORAL_TABLET | Freq: Every day | ORAL | Status: DC

## 2014-07-25 MED ORDER — ABACAVIR-DOLUTEGRAVIR-LAMIVUD 600-50-300 MG PO TABS: 1.0000 | ORAL_TABLET | Freq: Every day | ORAL | Status: DC

## 2014-08-02 ENCOUNTER — Ambulatory Visit: Payer: Self-pay

## 2014-08-29 ENCOUNTER — Ambulatory Visit (INDEPENDENT_AMBULATORY_CARE_PROVIDER_SITE_OTHER): Payer: Self-pay | Admitting: Infectious Disease

## 2014-08-29 ENCOUNTER — Encounter: Payer: Self-pay | Admitting: Infectious Disease

## 2014-08-29 VITALS — BP 113/80 | HR 82 | Temp 98.2°F | Ht 72.0 in | Wt 218.0 lb

## 2014-08-29 DIAGNOSIS — Z113 Encounter for screening for infections with a predominantly sexual mode of transmission: Secondary | ICD-10-CM

## 2014-08-29 DIAGNOSIS — Z79899 Other long term (current) drug therapy: Secondary | ICD-10-CM

## 2014-08-29 DIAGNOSIS — G4700 Insomnia, unspecified: Secondary | ICD-10-CM | POA: Insufficient documentation

## 2014-08-29 DIAGNOSIS — I10 Essential (primary) hypertension: Secondary | ICD-10-CM

## 2014-08-29 DIAGNOSIS — N183 Chronic kidney disease, stage 3 unspecified: Secondary | ICD-10-CM

## 2014-08-29 DIAGNOSIS — F332 Major depressive disorder, recurrent severe without psychotic features: Secondary | ICD-10-CM

## 2014-08-29 DIAGNOSIS — N62 Hypertrophy of breast: Secondary | ICD-10-CM

## 2014-08-29 DIAGNOSIS — B20 Human immunodeficiency virus [HIV] disease: Secondary | ICD-10-CM

## 2014-08-29 HISTORY — DX: Insomnia, unspecified: G47.00

## 2014-08-29 LAB — COMPLETE METABOLIC PANEL WITH GFR
ALK PHOS: 88 U/L (ref 39–117)
ALT: 79 U/L — ABNORMAL HIGH (ref 0–53)
AST: 41 U/L — AB (ref 0–37)
Albumin: 4.2 g/dL (ref 3.5–5.2)
BUN: 15 mg/dL (ref 6–23)
CO2: 24 meq/L (ref 19–32)
CREATININE: 1.56 mg/dL — AB (ref 0.50–1.35)
Calcium: 9.4 mg/dL (ref 8.4–10.5)
Chloride: 103 mEq/L (ref 96–112)
GFR, Est African American: 58 mL/min — ABNORMAL LOW
GFR, Est Non African American: 50 mL/min — ABNORMAL LOW
GLUCOSE: 119 mg/dL — AB (ref 70–99)
Potassium: 4.1 mEq/L (ref 3.5–5.3)
Sodium: 137 mEq/L (ref 135–145)
Total Bilirubin: 0.6 mg/dL (ref 0.2–1.2)
Total Protein: 8.2 g/dL (ref 6.0–8.3)

## 2014-08-29 LAB — CBC WITH DIFFERENTIAL/PLATELET
BASOS PCT: 0 % (ref 0–1)
Basophils Absolute: 0 10*3/uL (ref 0.0–0.1)
EOS ABS: 0.3 10*3/uL (ref 0.0–0.7)
Eosinophils Relative: 3 % (ref 0–5)
HEMATOCRIT: 45.5 % (ref 39.0–52.0)
HEMOGLOBIN: 16.4 g/dL (ref 13.0–17.0)
Lymphocytes Relative: 42 % (ref 12–46)
Lymphs Abs: 3.7 10*3/uL (ref 0.7–4.0)
MCH: 33.3 pg (ref 26.0–34.0)
MCHC: 36 g/dL (ref 30.0–36.0)
MCV: 92.3 fL (ref 78.0–100.0)
MPV: 9.8 fL (ref 8.6–12.4)
Monocytes Absolute: 0.8 10*3/uL (ref 0.1–1.0)
Monocytes Relative: 9 % (ref 3–12)
NEUTROS PCT: 46 % (ref 43–77)
Neutro Abs: 4 10*3/uL (ref 1.7–7.7)
Platelets: 333 10*3/uL (ref 150–400)
RBC: 4.93 MIL/uL (ref 4.22–5.81)
RDW: 14.4 % (ref 11.5–15.5)
WBC: 8.7 10*3/uL (ref 4.0–10.5)

## 2014-08-29 LAB — LIPID PANEL
CHOLESTEROL: 185 mg/dL (ref 0–200)
HDL: 30 mg/dL — ABNORMAL LOW (ref >=40)
LDL CALC: 109 mg/dL — AB (ref 0–99)
Total CHOL/HDL Ratio: 6.2 Ratio
Triglycerides: 229 mg/dL — ABNORMAL HIGH (ref <150)
VLDL: 46 mg/dL — ABNORMAL HIGH (ref 0–40)

## 2014-08-29 MED ORDER — TRAZODONE HCL 50 MG PO TABS: 50.0000 mg | ORAL_TABLET | Freq: Every day | ORAL | Status: DC

## 2014-08-29 NOTE — Progress Notes (Signed)
Subjective:    Patient ID: Jonathan Hendricks, male    DOB: 07/06/1960, 54 y.o.   MRN: [id number]  HPI   54 year old with HIV who has managed to control his HIV with Atripla but has suffered  He was enrolled into an [place] health care plan but now has lost his job due to problems with temper at work. He is now on ADAP  [facility] but since the change he has been suffering from insomnia and loose stools.  He is very anxious that the meds may not be working. His depression is doing better off of EFV.       Review of Systems  Constitutional: Positive for fatigue. Negative for fever, chills, diaphoresis, activity change, appetite change and unexpected weight change.  HENT: Negative for congestion, rhinorrhea, sinus pressure, sneezing, sore throat and trouble swallowing.   Eyes: Negative for photophobia and visual disturbance.  Respiratory: Negative for cough, chest tightness, shortness of breath, wheezing and stridor.   Cardiovascular: Negative for chest pain, palpitations and leg swelling.  Gastrointestinal: Positive for diarrhea. Negative for nausea, vomiting, abdominal pain, constipation, blood in stool, abdominal distention and anal bleeding.  Genitourinary: Negative for dysuria, hematuria, flank pain and difficulty urinating.  Musculoskeletal: Negative for myalgias, back pain, joint swelling, arthralgias and gait problem.  Skin: Negative for color change, pallor and wound.  Neurological: Negative for dizziness, tremors, weakness and light-headedness.  Hematological: Negative for adenopathy. Does not bruise/bleed easily.  Psychiatric/Behavioral: Positive for behavioral problems, sleep disturbance, dysphoric mood, decreased concentration and agitation. Negative for hallucinations, confusion and self-injury. The patient is nervous/anxious.        Objective:   Physical Exam  Constitutional: He is oriented to person, place, and time. He appears well-developed  and well-nourished. No distress.  HENT:  Head: Normocephalic and atraumatic.  Mouth/Throat: Oropharynx is clear and moist. No oropharyngeal exudate.  Eyes: Conjunctivae and EOM are normal. No scleral icterus.  Neck: Normal range of motion. Neck supple.  Cardiovascular: Normal rate, regular rhythm and normal heart sounds.   Pulmonary/Chest: Effort normal. No respiratory distress. He has no wheezes.    Abdominal: Soft. He exhibits no distension. There is no tenderness. There is no rebound.  Musculoskeletal: He exhibits no edema or tenderness.  Neurological: He is alert and oriented to person, place, and time. He exhibits normal muscle tone. Coordination normal.  Skin: Skin is warm and dry. He is not diaphoretic. No pallor.  Psychiatric: Judgment normal. His mood appears anxious. His affect is not angry and not inappropriate. His speech is not rapid and/or pressured, not delayed, not tangential and not slurred. He is slowed. He is not agitated, not aggressive and not hyperactive. Thought content is not paranoid and not delusional. Cognition and memory are not impaired. He exhibits a depressed mood. He expresses no homicidal ideation. He expresses no suicidal plans and no homicidal plans. He is communicative.          Assessment & Plan:   HIV:  Continue TRIUMEQ  And see if some of these SE will resolve. IF not will change to GENVOYA with food I spent greater than 40 minutes with the patient including greater than 50% of time in face to face counsel of the patient re HIV, ARV, depression and in coordination of their care.  Severe depression with prior passive suicidal thoughts: meeting with [name] again on the 17th.  Insomnia: start Trazadone, this could be due to DTG and or his anxiety  Loose stools should resolve with time   ARF on CKD; again needs to come off of a Truvada based regimen  HTN: Reasonably well controlled on hydrochlorothiazide and lisinopril but could be

## 2014-08-30 ENCOUNTER — Other Ambulatory Visit: Payer: Self-pay | Admitting: Licensed Clinical Social Worker

## 2014-08-30 LAB — HIV-1 RNA QUANT-NO REFLEX-BLD: HIV-1 RNA Quant, Log: <1.3 {Log} (ref <1.30)

## 2014-08-30 LAB — URINE CYTOLOGY ANCILLARY ONLY
Chlamydia: NEGATIVE
NEISSERIA GONORRHEA: NEGATIVE

## 2014-08-30 LAB — RPR

## 2014-08-30 MED ORDER — ABACAVIR-DOLUTEGRAVIR-LAMIVUD 600-50-300 MG PO TABS: 1.0000 | ORAL_TABLET | Freq: Every day | ORAL | Status: DC

## 2014-08-31 LAB — T-HELPER CELL (CD4) - (RCID CLINIC ONLY)
CD4 % Helper T Cell: 26 % — ABNORMAL LOW (ref 33–55)
CD4 T CELL ABS: 940 /uL (ref 400–2700)

## 2014-09-10 ENCOUNTER — Ambulatory Visit: Payer: Self-pay

## 2014-10-23 ENCOUNTER — Other Ambulatory Visit: Payer: Self-pay | Admitting: Infectious Disease

## 2014-11-23 ENCOUNTER — Other Ambulatory Visit: Payer: Self-pay | Admitting: Infectious Disease

## 2014-12-24 ENCOUNTER — Other Ambulatory Visit: Payer: Self-pay | Admitting: Infectious Disease

## 2014-12-24 DIAGNOSIS — F32A Depression, unspecified: Secondary | ICD-10-CM

## 2014-12-24 DIAGNOSIS — F329 Major depressive disorder, single episode, unspecified: Secondary | ICD-10-CM

## 2014-12-28 ENCOUNTER — Other Ambulatory Visit: Payer: Self-pay | Admitting: Infectious Disease

## 2014-12-28 DIAGNOSIS — Z21 Asymptomatic human immunodeficiency virus [HIV] infection status: Secondary | ICD-10-CM

## 2015-01-01 ENCOUNTER — Other Ambulatory Visit (INDEPENDENT_AMBULATORY_CARE_PROVIDER_SITE_OTHER): Payer: Self-pay

## 2015-01-01 DIAGNOSIS — Z113 Encounter for screening for infections with a predominantly sexual mode of transmission: Secondary | ICD-10-CM

## 2015-01-01 DIAGNOSIS — B20 Human immunodeficiency virus [HIV] disease: Secondary | ICD-10-CM

## 2015-01-01 LAB — COMPLETE METABOLIC PANEL WITH GFR
ALBUMIN: 4.3 g/dL (ref 3.6–5.1)
ALK PHOS: 79 U/L (ref 40–115)
ALT: 45 U/L (ref 9–46)
AST: 26 U/L (ref 10–35)
BILIRUBIN TOTAL: 1.1 mg/dL (ref 0.2–1.2)
BUN: 16 mg/dL (ref 7–25)
CALCIUM: 9.8 mg/dL (ref 8.6–10.3)
CO2: 26 mmol/L (ref 20–31)
CREATININE: 1.64 mg/dL — AB (ref 0.70–1.33)
Chloride: 100 mmol/L (ref 98–110)
GFR, EST AFRICAN AMERICAN: 54 mL/min — AB (ref >=60)
GFR, Est Non African American: 47 mL/min — ABNORMAL LOW (ref >=60)
Glucose, Bld: 113 mg/dL — ABNORMAL HIGH (ref 65–99)
Potassium: 4.1 mmol/L (ref 3.5–5.3)
Sodium: 136 mmol/L (ref 135–146)
TOTAL PROTEIN: 8.1 g/dL (ref 6.1–8.1)

## 2015-01-01 LAB — CBC WITH DIFFERENTIAL/PLATELET
BASOS ABS: 0 10*3/uL (ref 0.0–0.1)
Basophils Relative: 0 % (ref 0–1)
Eosinophils Absolute: 0.1 10*3/uL (ref 0.0–0.7)
Eosinophils Relative: 1 % (ref 0–5)
HEMATOCRIT: 46.8 % (ref 39.0–52.0)
HEMOGLOBIN: 16.4 g/dL (ref 13.0–17.0)
LYMPHS ABS: 4.9 10*3/uL — AB (ref 0.7–4.0)
LYMPHS PCT: 40 % (ref 12–46)
MCH: 33.4 pg (ref 26.0–34.0)
MCHC: 35 g/dL (ref 30.0–36.0)
MCV: 95.3 fL (ref 78.0–100.0)
MONOS PCT: 7 % (ref 3–12)
MPV: 10.1 fL (ref 8.6–12.4)
Monocytes Absolute: 0.9 10*3/uL (ref 0.1–1.0)
NEUTROS ABS: 6.4 10*3/uL (ref 1.7–7.7)
NEUTROS PCT: 52 % (ref 43–77)
PLATELETS: 347 10*3/uL (ref 150–400)
RBC: 4.91 MIL/uL (ref 4.22–5.81)
RDW: 13.4 % (ref 11.5–15.5)
WBC: 12.3 10*3/uL — ABNORMAL HIGH (ref 4.0–10.5)

## 2015-01-01 NOTE — Addendum Note (Signed)
Addended by: Mariea ClontsGREEN, Vinod Mikesell D on: 01/01/2015 04:30 PM   Modules accepted: Orders

## 2015-01-02 LAB — T-HELPER CELL (CD4) - (RCID CLINIC ONLY)
CD4 T CELL ABS: 1350 /uL (ref 400–2700)
CD4 T CELL HELPER: 28 % — AB (ref 33–55)

## 2015-01-02 LAB — HIV-1 RNA QUANT-NO REFLEX-BLD

## 2015-01-02 LAB — RPR

## 2015-01-03 LAB — URINE CYTOLOGY ANCILLARY ONLY
Chlamydia: NEGATIVE
Neisseria Gonorrhea: NEGATIVE

## 2015-01-18 ENCOUNTER — Other Ambulatory Visit: Payer: Self-pay | Admitting: Infectious Disease

## 2015-01-21 ENCOUNTER — Other Ambulatory Visit: Payer: Self-pay | Admitting: *Deleted

## 2015-01-21 DIAGNOSIS — B009 Herpesviral infection, unspecified: Secondary | ICD-10-CM

## 2015-01-21 MED ORDER — VALACYCLOVIR HCL 500 MG PO TABS
500.0000 mg | ORAL_TABLET | Freq: Every day | ORAL | Status: DC
Start: 2015-01-21 — End: 2015-06-12

## 2015-01-30 ENCOUNTER — Ambulatory Visit (INDEPENDENT_AMBULATORY_CARE_PROVIDER_SITE_OTHER): Payer: Self-pay | Admitting: Infectious Disease

## 2015-01-30 ENCOUNTER — Encounter: Payer: Self-pay | Admitting: Infectious Disease

## 2015-01-30 VITALS — BP 114/77 | HR 81 | Temp 98.0°F | Ht 72.0 in | Wt 216.8 lb

## 2015-01-30 DIAGNOSIS — Z23 Encounter for immunization: Secondary | ICD-10-CM

## 2015-01-30 DIAGNOSIS — R4589 Other symptoms and signs involving emotional state: Secondary | ICD-10-CM

## 2015-01-30 DIAGNOSIS — R4586 Emotional lability: Secondary | ICD-10-CM

## 2015-01-30 DIAGNOSIS — G4700 Insomnia, unspecified: Secondary | ICD-10-CM

## 2015-01-30 DIAGNOSIS — F332 Major depressive disorder, recurrent severe without psychotic features: Secondary | ICD-10-CM

## 2015-01-30 DIAGNOSIS — N62 Hypertrophy of breast: Secondary | ICD-10-CM

## 2015-01-30 DIAGNOSIS — F32A Depression, unspecified: Secondary | ICD-10-CM

## 2015-01-30 DIAGNOSIS — F603 Borderline personality disorder: Secondary | ICD-10-CM

## 2015-01-30 DIAGNOSIS — I10 Essential (primary) hypertension: Secondary | ICD-10-CM

## 2015-01-30 DIAGNOSIS — B20 Human immunodeficiency virus [HIV] disease: Secondary | ICD-10-CM

## 2015-01-30 DIAGNOSIS — T7421XD Adult sexual abuse, confirmed, subsequent encounter: Secondary | ICD-10-CM

## 2015-01-30 DIAGNOSIS — T7421XA Adult sexual abuse, confirmed, initial encounter: Secondary | ICD-10-CM

## 2015-01-30 DIAGNOSIS — F329 Major depressive disorder, single episode, unspecified: Secondary | ICD-10-CM

## 2015-01-30 HISTORY — DX: Adult sexual abuse, confirmed, initial encounter: T74.21XA

## 2015-01-30 MED ORDER — ESCITALOPRAM OXALATE 20 MG PO TABS: 20.0000 mg | ORAL_TABLET | Freq: Every day | ORAL | Status: DC

## 2015-01-30 NOTE — Progress Notes (Signed)
Chief complaint: severe depressive symptoms, insomnia, suicidal ideation  Subjective:    Patient ID: Jonathan Hendricks F Boulay, male    DOB: 04/24/1960, 54 y.o.   MRN: [id number]  HPI   54 year old with HIV who has managed to control his HIV with Atripla but has suffered development of gynecomastia, and likely worsening temper at work. He has now lost his job due to having trouble controlling his temper at work.  [facility] but since the change he has been suffering from insomnia.  I had placed him on lexapro and trazadone at night. He felt that the trazadone is not working at all and he was so upset by this and took 7 pills at once.   He has had regression of his gynecomastia and lesion in his left breast.   His depression is now worse and he is having thoughts of wanting to harm himself but no active plan. He is contracted for safety.  Past Medical History  Diagnosis Date  . IBD (inflammatory bowel disease)   . Constipation   . Rectal pain   . HIV disease (HCC)   . Depression   . Hypertension   . Recurrent major depression-severe (HCC) 07/16/2014  . Emotional lability 07/16/2014  . Gynecomastia 07/16/2014  . Insomnia 08/29/2014    No past surgical history on file.  Family History  Problem Relation Age of Onset  . Heart disease Mother       Social History   Social History  . Marital Status: Single    Spouse Name: N/A  . Number of Children: N/A  . Years of Education: N/A   Social History Main Topics  . Smoking status: Never Smoker   . Smokeless tobacco: Never Used  . Alcohol Use: Yes  . Drug Use: No  . Sexual Activity:    Partners: Male     Comment: given condoms   Other Topics Concern  . None   Social History Narrative    No Known Allergies   Current outpatient prescriptions:  .  Abacavir-Dolutegravir-Lamivud (TRIUMEQ) 600-50-300 MG TABS, Take 1 tablet by mouth daily., Disp: 30 tablet, Rfl: 11 .  lisinopril-hydrochlorothiazide  (PRINZIDE,ZESTORETIC) 20-25 MG per tablet, TAKE 1 TABLET BY MOUTH EVERY DAY, Disp: 90 tablet, Rfl: 2 .  OVER THE COUNTER MEDICATION, otc allergy eye gtts, Disp: , Rfl:  .  valACYclovir (VALTREX) 500 MG tablet, Take 1 tablet (500 mg total) by mouth daily., Disp: 90 tablet, Rfl: 0 .  escitalopram (LEXAPRO) 20 MG tablet, Take 1 tablet (20 mg total) by mouth daily., Disp: 30 tablet, Rfl: 11       Review of Systems  Constitutional: Positive for fatigue. Negative for fever, chills, diaphoresis, activity change, appetite change and unexpected weight change.  HENT: Negative for congestion, rhinorrhea, sinus pressure, sneezing, sore throat and trouble swallowing.   Eyes: Negative for photophobia and visual disturbance.  Respiratory: Negative for cough, chest tightness, shortness of breath, wheezing and stridor.   Cardiovascular: Negative for chest pain, palpitations and leg swelling.  Gastrointestinal: Positive for diarrhea. Negative for nausea, vomiting, abdominal pain, constipation, blood in stool, abdominal distention and anal bleeding.  Genitourinary: Negative for dysuria, hematuria, flank pain and difficulty urinating.  Musculoskeletal: Negative for myalgias, back pain, joint swelling, arthralgias and gait problem.  Skin: Negative for color change, pallor and wound.  Neurological: Negative for dizziness, tremors, weakness and light-headedness.  Hematological: Negative for adenopathy. Does not bruise/bleed easily.  Psychiatric/Behavioral: Positive for behavioral problems, sleep  disturbance, dysphoric mood, decreased concentration and agitation. Negative for hallucinations, confusion and self-injury. The patient is nervous/anxious.        Objective:   Physical Exam  Constitutional: He is oriented to person, place, and time. He appears well-developed and well-nourished. No distress.  HENT:  Head: Normocephalic and atraumatic.  Mouth/Throat: Oropharynx is clear and moist. No oropharyngeal  exudate.  Eyes: Conjunctivae and EOM are normal. No scleral icterus.  Neck: Normal range of motion. Neck supple.  Cardiovascular: Normal rate, regular rhythm and normal heart sounds.   Pulmonary/Chest: Effort normal. No respiratory distress. He has no wheezes.  Abdominal: Soft. He exhibits no distension. There is no tenderness. There is no rebound.  Musculoskeletal: He exhibits no edema or tenderness.  Neurological: He is alert and oriented to person, place, and time. He exhibits normal muscle tone. Coordination normal.  Skin: Skin is warm and dry. He is not diaphoretic. No pallor.  Psychiatric: Judgment normal. His mood appears anxious. His affect is not angry and not inappropriate. His speech is not rapid and/or pressured, not delayed, not tangential and not slurred. He is slowed. He is not agitated, not aggressive and not hyperactive. Thought content is not paranoid and not delusional. Cognition and memory are not impaired. He exhibits a depressed mood. He expresses no homicidal ideation. He expresses no suicidal plans and no homicidal plans. He is communicative.          Assessment & Plan:   HIV:  Change  TRIUMEQ  To am dose  Severe depression with suicidal thoughts. He was upset due to cancellations with Bernette Redbird. Will have him meet with Jody who came with me to meet with him today. He is contracted for safety. I will increase his lexapro to   Insomnia: stop trazadone and witch TRIUMEQ to am dose  ARF on CKD:off of TNF now. He needs to see a nephrologist. His BP is well controlled now  HTN: Reasonably well controlled on hydrochlorothiazide and lisinopri  I spent greater than 40 minutes with the patient including greater than 50% of time in face to face counsel of the patient re his HIV, his Severe major depression with SI, his insomnia, HTN, CKD  and in coordination of his care.

## 2015-01-30 NOTE — BH Specialist Note (Signed)
Counselor was referred by Dr. Daiva EvesVan Dam to meet with client based on a history of depression and inadequate pleasure in life.  Client was oriented times four with an agitated affect.  Client communicated about many negative issues in his life in a short period of time.  Client appears to engage in chronic negative self talk.  Counselor introduced self and encouraged client to meet for counseling services.  Client agreed and made his first appointment for after the holidays. Counselor provided client with office number in case he may feel the need to call prior to appointment.  Client was negative for any suicidal or homicidal ideation.  However, client did make a few references about wanting to hurt some people that got ion his nerves. Counselor provided support and encouragement accordingly.   Jenel LucksJodi Mustaf Antonacci, MA [facility]

## 2015-02-11 ENCOUNTER — Ambulatory Visit: Payer: Self-pay | Admitting: *Deleted

## 2015-02-26 ENCOUNTER — Ambulatory Visit: Payer: Self-pay

## 2015-02-27 ENCOUNTER — Ambulatory Visit: Payer: Self-pay | Admitting: *Deleted

## 2015-02-27 ENCOUNTER — Ambulatory Visit: Payer: Self-pay

## 2015-02-28 ENCOUNTER — Ambulatory Visit: Payer: Self-pay | Admitting: *Deleted

## 2015-02-28 DIAGNOSIS — R4589 Other symptoms and signs involving emotional state: Secondary | ICD-10-CM

## 2015-02-28 NOTE — BH Specialist Note (Signed)
Jonathan Hendricks was present for his scheduled appointment today with counselor.  Client was on time and oriented times four with good affect and dress.  Client was talkative and had no trouble sharing. Client shared the difficulties he had with his partner and their breakup.  Client spoke as if the breakup happened yesterday when actually it happened over 4 years ago.  Counselor discussed with client the idea of processing what happened but then turning towards focusing on the present.  Client appeared to be a little perturbed at counselor as evidenced by change in mood and facial expression when suggestion was made.  Client said he just wanted to tell me how the breakup went down.  Client indicated that he wanted another appointment for two weeks out.   Jenel LucksJodi Ashten Prats, MA [facility]

## 2015-03-14 ENCOUNTER — Ambulatory Visit: Payer: Self-pay | Admitting: *Deleted

## 2015-04-09 ENCOUNTER — Encounter: Payer: Self-pay | Admitting: *Deleted

## 2015-04-10 ENCOUNTER — Other Ambulatory Visit: Payer: Self-pay | Admitting: Infectious Disease

## 2015-04-10 DIAGNOSIS — I10 Essential (primary) hypertension: Secondary | ICD-10-CM

## 2015-04-10 DIAGNOSIS — F332 Major depressive disorder, recurrent severe without psychotic features: Secondary | ICD-10-CM

## 2015-05-01 ENCOUNTER — Ambulatory Visit: Payer: Self-pay | Admitting: Infectious Disease

## 2015-06-06 ENCOUNTER — Other Ambulatory Visit (INDEPENDENT_AMBULATORY_CARE_PROVIDER_SITE_OTHER): Payer: Self-pay

## 2015-06-06 DIAGNOSIS — Z79899 Other long term (current) drug therapy: Secondary | ICD-10-CM

## 2015-06-06 DIAGNOSIS — B20 Human immunodeficiency virus [HIV] disease: Secondary | ICD-10-CM

## 2015-06-06 DIAGNOSIS — Z113 Encounter for screening for infections with a predominantly sexual mode of transmission: Secondary | ICD-10-CM

## 2015-06-06 LAB — CBC WITH DIFFERENTIAL/PLATELET
BASOS ABS: 86 {cells}/uL (ref 0–200)
Basophils Relative: 1 %
EOS PCT: 2 %
Eosinophils Absolute: 172 cells/uL (ref 15–500)
HCT: 43.8 % (ref 38.5–50.0)
Hemoglobin: 15.3 g/dL (ref 13.2–17.1)
LYMPHS PCT: 45 %
Lymphs Abs: 3870 cells/uL (ref 850–3900)
MCH: 33.6 pg — AB (ref 27.0–33.0)
MCHC: 34.9 g/dL (ref 32.0–36.0)
MCV: 96.3 fL (ref 80.0–100.0)
MONO ABS: 516 {cells}/uL (ref 200–950)
MPV: 10.3 fL (ref 7.5–12.5)
Monocytes Relative: 6 %
NEUTROS PCT: 46 %
Neutro Abs: 3956 cells/uL (ref 1500–7800)
PLATELETS: 289 10*3/uL (ref 140–400)
RBC: 4.55 MIL/uL (ref 4.20–5.80)
RDW: 13.1 % (ref 11.0–15.0)
WBC: 8.6 10*3/uL (ref 3.8–10.8)

## 2015-06-06 LAB — COMPLETE METABOLIC PANEL WITH GFR
ALBUMIN: 3.9 g/dL (ref 3.6–5.1)
ALK PHOS: 73 U/L (ref 40–115)
ALT: 29 U/L (ref 9–46)
AST: 22 U/L (ref 10–35)
BUN: 14 mg/dL (ref 7–25)
CALCIUM: 8.8 mg/dL (ref 8.6–10.3)
CO2: 25 mmol/L (ref 20–31)
CREATININE: 1.47 mg/dL — AB (ref 0.70–1.33)
Chloride: 100 mmol/L (ref 98–110)
GFR, Est African American: 62 mL/min (ref >=60)
GFR, Est Non African American: 53 mL/min — ABNORMAL LOW (ref >=60)
GLUCOSE: 168 mg/dL — AB (ref 65–99)
Potassium: 4.3 mmol/L (ref 3.5–5.3)
SODIUM: 138 mmol/L (ref 135–146)
Total Bilirubin: 0.6 mg/dL (ref 0.2–1.2)
Total Protein: 7.6 g/dL (ref 6.1–8.1)

## 2015-06-06 LAB — LIPID PANEL
CHOLESTEROL: 156 mg/dL (ref 125–200)
HDL: 28 mg/dL — ABNORMAL LOW (ref >=40)
LDL Cholesterol: 76 mg/dL (ref <130)
Total CHOL/HDL Ratio: 5.6 Ratio — ABNORMAL HIGH (ref <=5.0)
Triglycerides: 259 mg/dL — ABNORMAL HIGH (ref <150)
VLDL: 52 mg/dL — ABNORMAL HIGH (ref <30)

## 2015-06-07 LAB — RPR

## 2015-06-07 LAB — T-HELPER CELL (CD4) - (RCID CLINIC ONLY)
CD4 % Helper T Cell: 25 % — ABNORMAL LOW (ref 33–55)
CD4 T Cell Abs: 1010 /uL (ref 400–2700)

## 2015-06-07 LAB — HIV-1 RNA QUANT-NO REFLEX-BLD

## 2015-06-12 ENCOUNTER — Other Ambulatory Visit: Payer: Self-pay | Admitting: Infectious Disease

## 2015-06-18 ENCOUNTER — Encounter: Payer: Self-pay | Admitting: Infectious Disease

## 2015-06-18 ENCOUNTER — Ambulatory Visit (INDEPENDENT_AMBULATORY_CARE_PROVIDER_SITE_OTHER): Payer: Self-pay | Admitting: Infectious Disease

## 2015-06-18 VITALS — BP 129/82 | HR 76 | Temp 97.7°F | Wt 225.0 lb

## 2015-06-18 DIAGNOSIS — M25519 Pain in unspecified shoulder: Secondary | ICD-10-CM | POA: Insufficient documentation

## 2015-06-18 DIAGNOSIS — T7421XD Adult sexual abuse, confirmed, subsequent encounter: Secondary | ICD-10-CM

## 2015-06-18 DIAGNOSIS — N183 Chronic kidney disease, stage 3 unspecified: Secondary | ICD-10-CM

## 2015-06-18 DIAGNOSIS — R4586 Emotional lability: Secondary | ICD-10-CM

## 2015-06-18 DIAGNOSIS — B20 Human immunodeficiency virus [HIV] disease: Secondary | ICD-10-CM

## 2015-06-18 DIAGNOSIS — M2550 Pain in unspecified joint: Secondary | ICD-10-CM

## 2015-06-18 DIAGNOSIS — M25511 Pain in right shoulder: Secondary | ICD-10-CM

## 2015-06-18 DIAGNOSIS — F332 Major depressive disorder, recurrent severe without psychotic features: Secondary | ICD-10-CM

## 2015-06-18 DIAGNOSIS — I10 Essential (primary) hypertension: Secondary | ICD-10-CM

## 2015-06-18 HISTORY — DX: Pain in unspecified shoulder: M25.519

## 2015-06-18 NOTE — Progress Notes (Signed)
Chief complaint: followup for HIV, depression, PTSD, CKD, HTN, he had mx complaints about his encounter with Lennox LaityJodi from last visit  Subjective:    Patient ID: Jonathan Hendricks, male    DOB: 01/04/1961, 55 y.o.   MRN: [id number]  HPI   55 year old with HIV who has managed to control his HIV with Atripla but has suffered development of gynecomastia, and likely worsening temper at work. He had then  lost his job due to having trouble controlling his temper at work.  [facility] but since the change he had been suffering from insomnia.  I had placed him on lexapro and trazadone at night.   At last visit his depression was significantly worse. Today he did not bring up the subject until I did .  He then proceeded to tell me he had not been able to go to work due to continuing to lose his temper at work. 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  He proceeded to tell me about his desire to obtain disability. I told him that the only grounds he could be considered for disability in my opinion was for his severe depression, PTSD.  Past Medical History  Diagnosis Date  . IBD (inflammatory bowel disease)   . Constipation   . Rectal pain   . HIV disease (HCC)   . Depression   . Hypertension   . Recurrent major depression-severe (HCC) 07/16/2014  . Emotional lability 07/16/2014  . Gynecomastia 07/16/2014  . Insomnia 08/29/2014    No past surgical history on file.  Family History  Problem Relation Age of Onset  . Heart disease Mother       Social History   Social History  . Marital Status: Single    Spouse Name: N/A  . Number of Children: N/A  . Years of Education: N/A   Social History Main Topics  . Smoking status: Never Smoker   . Smokeless tobacco: Never Used  . Alcohol Use: 0.0 oz/week    0 Standard drinks or equivalent per week  . Drug Use: No  . Sexual Activity:    Partners: Male     Comment: patient  declined condoms   Other Topics Concern  . None   Social History Narrative    No Known Allergies   Current outpatient prescriptions:  .  Abacavir-Dolutegravir-Lamivud (TRIUMEQ) 600-50-300 MG TABS, Take 1 tablet by mouth daily., Disp: 30 tablet, Rfl: 11 .  escitalopram (LEXAPRO) 20 MG tablet, Take 1 tablet (20 mg total) by mouth daily., Disp: 30 tablet, Rfl: 11 .  lisinopril-hydrochlorothiazide (PRINZIDE,ZESTORETIC) 20-25 MG tablet, TAKE 1 TABLET BY MOUTH EVERY DAY, Disp: 90 tablet, Rfl: 3 .  OVER THE COUNTER MEDICATION, otc allergy eye gtts, Disp: , Rfl:  .  valACYclovir (VALTREX) 500 MG tablet, TAKE 1 TABLET(500 MG) BY MOUTH DAILY, Disp: 90 tablet, Rfl: 1       Review of Systems  Constitutional: Positive for fatigue. Negative for fever, chills, diaphoresis, activity change, appetite change and unexpected weight change.  HENT: Negative for congestion, rhinorrhea, sinus pressure, sneezing, sore throat and trouble swallowing.   Eyes: Negative for photophobia and visual disturbance.  Respiratory: Negative for cough, chest tightness, shortness of breath, wheezing and stridor.   Cardiovascular: Negative for chest pain, palpitations and leg swelling.  Gastrointestinal: Negative for nausea, vomiting, abdominal pain, constipation, blood in stool, abdominal distention and anal bleeding.  Genitourinary: Negative for dysuria, hematuria,  flank pain and difficulty urinating.  Musculoskeletal: Positive for joint swelling. Negative for myalgias, back pain, arthralgias and gait problem.  Skin: Negative for color change, pallor and wound.  Neurological: Negative for dizziness, tremors, weakness and light-headedness.  Hematological: Negative for adenopathy. Does not bruise/bleed easily.  Psychiatric/Behavioral: Positive for behavioral problems, sleep disturbance, dysphoric mood and decreased concentration. Negative for suicidal ideas, hallucinations, confusion and self-injury. The patient is  nervous/anxious.        Objective:   Physical Exam  Constitutional: He is oriented to person, place, and time. He appears well-developed and well-nourished. No distress.  HENT:  Head: Normocephalic and atraumatic.  Mouth/Throat: Oropharynx is clear and moist. No oropharyngeal exudate.  Eyes: Conjunctivae and EOM are normal. No scleral icterus.  Neck: Normal range of motion. Neck supple.  Cardiovascular: Normal rate, regular rhythm and normal heart sounds.   Pulmonary/Chest: Effort normal. No respiratory distress. He has no wheezes.  Abdominal: Soft. He exhibits no distension. There is no tenderness. There is no rebound.  Musculoskeletal: He exhibits no edema or tenderness.  Neurological: He is alert and oriented to person, place, and time. He exhibits normal muscle tone. Coordination normal.  Skin: Skin is warm and dry. He is not diaphoretic. No pallor.  Psychiatric: Judgment normal. His mood appears anxious. His affect is not angry and not inappropriate. His speech is not rapid and/or pressured, not delayed, not tangential and not slurred. He is slowed. He is not agitated, not aggressive and not hyperactive. Thought content is not paranoid and not delusional. Cognition and memory are not impaired. He exhibits a depressed mood. He expresses no homicidal ideation. He expresses no suicidal plans and no homicidal plans. He is communicative.          Assessment & Plan:   HIV:  Change  TRIUMEQ  n am.  Severe depression with suicidal thoughts. He was upset due to cancellations with Bernette Redbird. I then introduced him to [place] and he was upset by that interaction and almost came back to complain further about Lennox Laity.  I think he really does need to get into treatment somewhere. I also advised consideration of Peer Support  ARF on CKD:off of TNF now. He needs to see a nephrologist. 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  I spent greater than 40 minutes with the patient including greater than 50% of time in face to face counsel of the patient re his HIV, his Severe major depression, migratory arthritis, HTN, CKD  and in coordination of his care.

## 2015-06-24 ENCOUNTER — Telehealth: Payer: Self-pay | Admitting: Infectious Disease

## 2015-06-24 MED ORDER — FLUCONAZOLE 100 MG PO TABS: 200.0000 mg | ORAL_TABLET | Freq: Every day | ORAL | Status: DC

## 2015-06-24 NOTE — Telephone Encounter (Signed)
Pt complains of horrible ring worm. Asked for oral med covered by [employer]. Will give 2 weeks of vliuconazole

## 2015-07-16 ENCOUNTER — Ambulatory Visit (INDEPENDENT_AMBULATORY_CARE_PROVIDER_SITE_OTHER): Payer: Self-pay | Admitting: Internal Medicine

## 2015-07-16 ENCOUNTER — Other Ambulatory Visit: Payer: Self-pay

## 2015-07-16 ENCOUNTER — Encounter: Payer: Self-pay | Admitting: Internal Medicine

## 2015-07-16 VITALS — BP 129/82 | HR 76 | Temp 98.6°F | Wt 223.0 lb

## 2015-07-16 DIAGNOSIS — Z113 Encounter for screening for infections with a predominantly sexual mode of transmission: Secondary | ICD-10-CM

## 2015-07-16 DIAGNOSIS — Z21 Asymptomatic human immunodeficiency virus [HIV] infection status: Secondary | ICD-10-CM

## 2015-07-16 DIAGNOSIS — R21 Rash and other nonspecific skin eruption: Secondary | ICD-10-CM

## 2015-07-16 DIAGNOSIS — B20 Human immunodeficiency virus [HIV] disease: Secondary | ICD-10-CM

## 2015-07-16 MED ORDER — TRIAMCINOLONE ACETONIDE 0.025 % EX CREA: 1.0000 "application " | TOPICAL_CREAM | Freq: Two times a day (BID) | CUTANEOUS | Status: DC

## 2015-07-16 MED ORDER — FLUCONAZOLE 100 MG PO TABS: 200.0000 mg | ORAL_TABLET | Freq: Every day | ORAL | Status: DC

## 2015-07-16 NOTE — Assessment & Plan Note (Signed)
Will check for syphilis first and try steroid cream.  If rash persists over the next 7-10 days, will have to consider drug rash and change of therapy.  Patient to call if it doesn't resolve

## 2015-07-16 NOTE — Assessment & Plan Note (Signed)
He will follow up with his primary provider as scheduled

## 2015-07-16 NOTE — Progress Notes (Signed)
   Subjective:    Patient ID: Jonathan Hendricks, male    DOB: 01-17-1961, 55 y.o.   MRN: [id number]  HPI Here for a rash.    He noted rash about 1 month ago.  Initially itchy and started on chest and arms.  Multiple erthematous areas.  Previously had a simialr rash and told by a dermatologist it was ringworm, treated with antifungal and it resolved. He has been using the antifungal again but it is not resolving.  Not itchy now.  He does endorse recent unprotected sexual encounter preceeding the rash.  No fever, no chills.    Review of Systems  Skin: Positive for rash.  Neurological: Negative for dizziness.       Objective:   Physical Exam  Constitutional: He appears well-developed and well-nourished. No distress.  Eyes: No scleral icterus.  Skin: Rash noted.  Diffuse erythematous rash on arms, chest, groin, back; multiple 1-2 cm areas;          Assessment & Plan:

## 2015-07-16 NOTE — Patient Instructions (Signed)
Call us in 7-10 days if no improvement

## 2015-07-17 LAB — RPR

## 2015-08-10 ENCOUNTER — Other Ambulatory Visit: Payer: Self-pay | Admitting: Infectious Disease

## 2015-08-10 DIAGNOSIS — B20 Human immunodeficiency virus [HIV] disease: Secondary | ICD-10-CM

## 2015-09-25 ENCOUNTER — Other Ambulatory Visit: Payer: Self-pay | Admitting: Internal Medicine

## 2015-10-07 ENCOUNTER — Other Ambulatory Visit (INDEPENDENT_AMBULATORY_CARE_PROVIDER_SITE_OTHER): Payer: Self-pay

## 2015-10-07 ENCOUNTER — Ambulatory Visit: Payer: Self-pay

## 2015-10-07 DIAGNOSIS — Z79899 Other long term (current) drug therapy: Secondary | ICD-10-CM

## 2015-10-07 DIAGNOSIS — B20 Human immunodeficiency virus [HIV] disease: Secondary | ICD-10-CM

## 2015-10-07 DIAGNOSIS — Z113 Encounter for screening for infections with a predominantly sexual mode of transmission: Secondary | ICD-10-CM

## 2015-10-07 LAB — COMPLETE METABOLIC PANEL WITH GFR
ALBUMIN: 4.4 g/dL (ref 3.6–5.1)
ALK PHOS: 70 U/L (ref 40–115)
ALT: 57 U/L — ABNORMAL HIGH (ref 9–46)
AST: 31 U/L (ref 10–35)
BUN: 17 mg/dL (ref 7–25)
CHLORIDE: 100 mmol/L (ref 98–110)
CO2: 26 mmol/L (ref 20–31)
Calcium: 9.3 mg/dL (ref 8.6–10.3)
Creat: 1.81 mg/dL — ABNORMAL HIGH (ref 0.70–1.33)
GFR, Est African American: 48 mL/min — ABNORMAL LOW (ref >=60)
GFR, Est Non African American: 41 mL/min — ABNORMAL LOW (ref >=60)
GLUCOSE: 83 mg/dL (ref 65–99)
POTASSIUM: 3.9 mmol/L (ref 3.5–5.3)
SODIUM: 137 mmol/L (ref 135–146)
Total Bilirubin: 0.5 mg/dL (ref 0.2–1.2)
Total Protein: 8.2 g/dL — ABNORMAL HIGH (ref 6.1–8.1)

## 2015-10-07 LAB — CBC WITH DIFFERENTIAL/PLATELET
BASOS ABS: 0 {cells}/uL (ref 0–200)
Basophils Relative: 0 %
EOS ABS: 129 {cells}/uL (ref 15–500)
Eosinophils Relative: 1 %
HEMATOCRIT: 46 % (ref 38.5–50.0)
HEMOGLOBIN: 16.1 g/dL (ref 13.2–17.1)
LYMPHS ABS: 4902 {cells}/uL — AB (ref 850–3900)
Lymphocytes Relative: 38 %
MCH: 33.2 pg — AB (ref 27.0–33.0)
MCHC: 35 g/dL (ref 32.0–36.0)
MCV: 94.8 fL (ref 80.0–100.0)
MONO ABS: 1161 {cells}/uL — AB (ref 200–950)
MPV: 9.9 fL (ref 7.5–12.5)
Monocytes Relative: 9 %
NEUTROS ABS: 6708 {cells}/uL (ref 1500–7800)
NEUTROS PCT: 52 %
Platelets: 319 10*3/uL (ref 140–400)
RBC: 4.85 MIL/uL (ref 4.20–5.80)
RDW: 13.6 % (ref 11.0–15.0)
WBC: 12.9 10*3/uL — ABNORMAL HIGH (ref 3.8–10.8)

## 2015-10-07 LAB — LIPID PANEL
Cholesterol: 179 mg/dL (ref 125–200)
HDL: 26 mg/dL — ABNORMAL LOW (ref >=40)
LDL CALC: 93 mg/dL (ref <130)
Total CHOL/HDL Ratio: 6.9 Ratio — ABNORMAL HIGH (ref <=5.0)
Triglycerides: 301 mg/dL — ABNORMAL HIGH (ref <150)
VLDL: 60 mg/dL — AB (ref <30)

## 2015-10-08 LAB — T-HELPER CELL (CD4) - (RCID CLINIC ONLY)
CD4 % Helper T Cell: 27 % — ABNORMAL LOW (ref 33–55)
CD4 T CELL ABS: 1500 /uL (ref 400–2700)

## 2015-10-08 LAB — URINE CYTOLOGY ANCILLARY ONLY
CHLAMYDIA, DNA PROBE: NEGATIVE
NEISSERIA GONORRHEA: NEGATIVE

## 2015-10-08 LAB — RPR

## 2015-10-09 ENCOUNTER — Ambulatory Visit (INDEPENDENT_AMBULATORY_CARE_PROVIDER_SITE_OTHER): Payer: Self-pay | Admitting: Infectious Disease

## 2015-10-09 ENCOUNTER — Encounter: Payer: Self-pay | Admitting: Infectious Disease

## 2015-10-09 VITALS — BP 125/90 | HR 92 | Temp 98.5°F | Ht 72.0 in | Wt 222.0 lb

## 2015-10-09 DIAGNOSIS — F332 Major depressive disorder, recurrent severe without psychotic features: Secondary | ICD-10-CM

## 2015-10-09 DIAGNOSIS — T7421XD Adult sexual abuse, confirmed, subsequent encounter: Secondary | ICD-10-CM

## 2015-10-09 DIAGNOSIS — B20 Human immunodeficiency virus [HIV] disease: Secondary | ICD-10-CM

## 2015-10-09 DIAGNOSIS — Z21 Asymptomatic human immunodeficiency virus [HIV] infection status: Secondary | ICD-10-CM

## 2015-10-09 DIAGNOSIS — L290 Pruritus ani: Secondary | ICD-10-CM

## 2015-10-09 DIAGNOSIS — R21 Rash and other nonspecific skin eruption: Secondary | ICD-10-CM

## 2015-10-09 LAB — HIV-1 RNA QUANT-NO REFLEX-BLD: HIV-1 RNA Quant, Log: <1.3 Log copies/mL (ref <1.30)

## 2015-10-09 NOTE — Progress Notes (Signed)
Chief complaint: followup for HIV, depression, rash that is reemerged after ceasing use of topical corticosteroids, anal pruritus  Subjective:    Patient ID: Jonathan Hendricks, male    DOB: [birth year], 55 y.o.   MRN: [id number]  HPI  55 year old with HIV who has managed to control his HIV with Atripla but has suffered development of gynecomastia, and likely worsening temper at work. He had then  lost his job due to having trouble controlling his temper at work.  [facility] but since the change he had been suffering from insomnia.  I had placed him on lexapro and trazadone at night.   Since I last saw [name] he had developed a rash which he initially thought was ringworm. He saw my partner Dr. Luciana Axecomer who thought that this was an allergic mediated rash and the patient did indeed respond to topical corticosteroids. He ran out of the steroids recently has had reemergence of his rash on his legs and his back. He has forward steroid cream again. He claims he has not changed detergents or exposed to any medications. He does not have insurance so that he can see a dermatologist I will prescribe him topical quarter steroids again.  He is also suffering the anal pruritus and wonders if he is contracted an STD as he has had unprotected receptive anal intercourse.   Past Medical History:  Diagnosis Date  . Constipation   . Depression   . Emotional lability 07/16/2014  . Gynecomastia 07/16/2014  . HIV disease (HCC)   . Hypertension   . IBD (inflammatory bowel disease)   . Insomnia 08/29/2014  . Pain in joint, shoulder region 06/18/2015  . Rectal pain   . Recurrent major depression-severe (HCC) 07/16/2014    No past surgical history on file.  Family History  Problem Relation Age of Onset  . Heart disease Mother       Social History   Social History  . Marital status: Single    Spouse name: N/A  . Number of children: N/A  . Years of education: N/A   Social History Main  Topics  . Smoking status: Never Smoker  . Smokeless tobacco: Never Used  . Alcohol use 0.0 oz/week     Comment: OCCAS.  . Drug use: No  . Sexual activity: Not Currently    Partners: Male     Comment: patient declined condoms   Other Topics Concern  . None   Social History Narrative  . None    No Known Allergies   Current Outpatient Prescriptions:  .  escitalopram (LEXAPRO) 20 MG tablet, Take 1 tablet (20 mg total) by mouth daily., Disp: 30 tablet, Rfl: 11 .  lisinopril-hydrochlorothiazide (PRINZIDE,ZESTORETIC) 20-25 MG tablet, TAKE 1 TABLET BY MOUTH EVERY DAY, Disp: 90 tablet, Rfl: 3 .  triamcinolone (KENALOG) 0.025 % cream, APPLY EXTERNALLY TO THE AFFECTED AREA TWICE DAILY, Disp: 30 g, Rfl: 0 .  TRIUMEQ 600-50-300 MG tablet, TAKE 1 TABLET BY MOUTH DAILY, Disp: 30 tablet, Rfl: 5 .  valACYclovir (VALTREX) 500 MG tablet, TAKE 1 TABLET(500 MG) BY MOUTH DAILY, Disp: 90 tablet, Rfl: 1 .  fluconazole (DIFLUCAN) 100 MG tablet, Take 2 tablets (200 mg total) by mouth daily., Disp: 28 tablet, Rfl: 1 .  OVER THE COUNTER MEDICATION, otc allergy eye gtts, Disp: , Rfl:        Review of Systems  Constitutional: Positive for fatigue. Negative for activity change, appetite change, chills, diaphoresis, fever and unexpected  weight change.  HENT: Negative for congestion, rhinorrhea, sinus pressure, sneezing, sore throat and trouble swallowing.   Eyes: Negative for photophobia and visual disturbance.  Respiratory: Negative for cough, chest tightness, shortness of breath, wheezing and stridor.   Cardiovascular: Negative for chest pain, palpitations and leg swelling.  Gastrointestinal: Negative for abdominal distention, abdominal pain, anal bleeding, blood in stool, constipation, nausea and vomiting.  Genitourinary: Negative for difficulty urinating, dysuria, flank pain and hematuria.  Musculoskeletal: Negative for arthralgias, back pain, gait problem and myalgias.  Skin: Positive for rash.  Negative for color change, pallor and wound.  Neurological: Negative for dizziness, tremors, weakness and light-headedness.  Hematological: Negative for adenopathy. Does not bruise/bleed easily.  Psychiatric/Behavioral: Positive for behavioral problems, decreased concentration, dysphoric mood and sleep disturbance. Negative for confusion, hallucinations, self-injury and suicidal ideas. The patient is nervous/anxious.        Objective:   Physical Exam  Constitutional: He is oriented to person, place, and time. He appears well-developed and well-nourished. No distress.  HENT:  Head: Normocephalic and atraumatic.  Mouth/Throat: Oropharynx is clear and moist. No oropharyngeal exudate.  Eyes: Conjunctivae and EOM are normal. No scleral icterus.  Neck: Normal range of motion. Neck supple.  Cardiovascular: Normal rate, regular rhythm and normal heart sounds.   Pulmonary/Chest: Effort normal. No respiratory distress. He has no wheezes.  Abdominal: Soft. He exhibits no distension. There is no tenderness. There is no rebound.  Musculoskeletal: He exhibits no edema or tenderness.  Neurological: He is alert and oriented to person, place, and time. He exhibits normal muscle tone. Coordination normal.  Skin: Skin is warm and dry. He is not diaphoretic. No pallor.  Psychiatric: Judgment normal. His mood appears anxious. His affect is not angry and not inappropriate. His speech is not rapid and/or pressured, not delayed, not tangential and not slurred. He is slowed. He is not agitated, not aggressive and not hyperactive. Thought content is not paranoid and not delusional. Cognition and memory are not impaired. He exhibits a depressed mood. He expresses no homicidal ideation. He expresses no suicidal plans and no homicidal plans. He is communicative.          Assessment & Plan:   HIV:  Change  TRIUMEQ  n am.  Severe depression with suicidal thoughts. He is on antidepressants and was offered a visit  with Dorene SorrowJerry today but did not have the right mindset he said to see her today. t  ARF on CKD:off of TNF now. He needs to see a nephrologist. His BP is well controlled now  HTN:  well controlled on hydrochlorothiazide and lisinopri Vitals:   10/09/15 1059  BP: 125/90  Pulse: 92  Temp: 98.5 F (36.9 C)    Rash: Renew topical corticosteroids may need a punch biopsy in future  Anal pruritus test for STDs he does not have any we can try for example steroid such as Anusol  I spent greater than 25  minutes with the patient including greater than 50% of time in face to face counsel of the patient re his HIV, his Severe major depression,  HTN, CKD anal pruritis and topical rash and in coordination of his care.

## 2015-10-10 LAB — CYTOLOGY, (ORAL, ANAL, URETHRAL) ANCILLARY ONLY
CHLAMYDIA, DNA PROBE: NEGATIVE
CHLAMYDIA, DNA PROBE: NEGATIVE
NEISSERIA GONORRHEA: NEGATIVE
NEISSERIA GONORRHEA: NEGATIVE

## 2015-10-14 ENCOUNTER — Telehealth: Payer: Self-pay | Admitting: *Deleted

## 2015-10-14 NOTE — Telephone Encounter (Signed)
Wanting to know results from STI testing from 10/09/15.  Shared that all cytology results were negative.

## 2015-10-17 ENCOUNTER — Encounter: Payer: Self-pay | Admitting: Infectious Disease

## 2015-10-21 ENCOUNTER — Ambulatory Visit: Payer: Self-pay | Admitting: Infectious Disease

## 2015-12-31 ENCOUNTER — Ambulatory Visit (INDEPENDENT_AMBULATORY_CARE_PROVIDER_SITE_OTHER): Payer: Self-pay

## 2015-12-31 DIAGNOSIS — Z23 Encounter for immunization: Secondary | ICD-10-CM

## 2016-01-08 NOTE — Progress Notes (Signed)
This encounter was created in error - please disregard.

## 2016-02-09 ENCOUNTER — Other Ambulatory Visit: Payer: Self-pay | Admitting: Internal Medicine

## 2016-02-09 DIAGNOSIS — B20 Human immunodeficiency virus [HIV] disease: Secondary | ICD-10-CM

## 2016-03-07 ENCOUNTER — Other Ambulatory Visit: Payer: Self-pay | Admitting: Infectious Disease

## 2016-04-10 ENCOUNTER — Ambulatory Visit: Payer: Self-pay

## 2016-04-22 ENCOUNTER — Encounter: Payer: Self-pay | Admitting: Infectious Disease

## 2016-05-26 ENCOUNTER — Other Ambulatory Visit (INDEPENDENT_AMBULATORY_CARE_PROVIDER_SITE_OTHER): Payer: Self-pay

## 2016-05-26 DIAGNOSIS — Z21 Asymptomatic human immunodeficiency virus [HIV] infection status: Secondary | ICD-10-CM

## 2016-05-26 DIAGNOSIS — Z113 Encounter for screening for infections with a predominantly sexual mode of transmission: Secondary | ICD-10-CM

## 2016-05-26 DIAGNOSIS — B20 Human immunodeficiency virus [HIV] disease: Secondary | ICD-10-CM

## 2016-05-26 DIAGNOSIS — Z79899 Other long term (current) drug therapy: Secondary | ICD-10-CM

## 2016-05-26 LAB — COMPREHENSIVE METABOLIC PANEL
ALK PHOS: 66 U/L (ref 40–115)
ALT: 35 U/L (ref 9–46)
AST: 21 U/L (ref 10–35)
Albumin: 3.8 g/dL (ref 3.6–5.1)
BUN: 12 mg/dL (ref 7–25)
CALCIUM: 8.9 mg/dL (ref 8.6–10.3)
CHLORIDE: 103 mmol/L (ref 98–110)
CO2: 25 mmol/L (ref 20–31)
Creat: 1.57 mg/dL — ABNORMAL HIGH (ref 0.70–1.33)
Glucose, Bld: 118 mg/dL — ABNORMAL HIGH (ref 65–99)
POTASSIUM: 4.3 mmol/L (ref 3.5–5.3)
Sodium: 139 mmol/L (ref 135–146)
TOTAL PROTEIN: 7.3 g/dL (ref 6.1–8.1)
Total Bilirubin: 0.6 mg/dL (ref 0.2–1.2)

## 2016-05-26 LAB — CBC
HCT: 45 % (ref 38.5–50.0)
HEMOGLOBIN: 15.3 g/dL (ref 13.2–17.1)
MCH: 32.8 pg (ref 27.0–33.0)
MCHC: 34 g/dL (ref 32.0–36.0)
MCV: 96.4 fL (ref 80.0–100.0)
MPV: 10 fL (ref 7.5–12.5)
PLATELETS: 308 10*3/uL (ref 140–400)
RBC: 4.67 MIL/uL (ref 4.20–5.80)
RDW: 13.5 % (ref 11.0–15.0)
WBC: 8.6 10*3/uL (ref 3.8–10.8)

## 2016-05-26 LAB — LIPID PANEL
CHOL/HDL RATIO: 6.3 ratio — AB (ref <5.0)
CHOLESTEROL: 164 mg/dL (ref <200)
HDL: 26 mg/dL — AB (ref >40)
LDL Cholesterol: 96 mg/dL (ref <100)
TRIGLYCERIDES: 208 mg/dL — AB (ref <150)
VLDL: 42 mg/dL — AB (ref <30)

## 2016-05-27 LAB — T-HELPER CELL (CD4) - (RCID CLINIC ONLY)
CD4 T CELL ABS: 1090 /uL (ref 400–2700)
CD4 T CELL HELPER: 26 % — AB (ref 33–55)

## 2016-05-27 LAB — URINE CYTOLOGY ANCILLARY ONLY
Chlamydia: NEGATIVE
Neisseria Gonorrhea: NEGATIVE

## 2016-05-28 LAB — HIV-1 RNA QUANT-NO REFLEX-BLD
HIV 1 RNA Quant: 28 copies/mL — ABNORMAL HIGH
HIV-1 RNA QUANT, LOG: 1.45 {Log_copies}/mL — AB

## 2016-06-10 ENCOUNTER — Ambulatory Visit: Payer: Self-pay | Admitting: Infectious Disease

## 2016-08-01 ENCOUNTER — Other Ambulatory Visit: Payer: Self-pay | Admitting: Internal Medicine

## 2016-08-01 DIAGNOSIS — B20 Human immunodeficiency virus [HIV] disease: Secondary | ICD-10-CM

## 2016-08-05 ENCOUNTER — Other Ambulatory Visit: Payer: Self-pay | Admitting: Infectious Disease

## 2016-08-05 ENCOUNTER — Other Ambulatory Visit: Payer: Self-pay | Admitting: Internal Medicine

## 2016-08-05 DIAGNOSIS — I10 Essential (primary) hypertension: Secondary | ICD-10-CM

## 2016-08-05 DIAGNOSIS — B20 Human immunodeficiency virus [HIV] disease: Secondary | ICD-10-CM

## 2016-08-17 ENCOUNTER — Encounter: Payer: Self-pay | Admitting: Infectious Disease

## 2016-08-17 ENCOUNTER — Ambulatory Visit (INDEPENDENT_AMBULATORY_CARE_PROVIDER_SITE_OTHER): Payer: Self-pay | Admitting: Infectious Disease

## 2016-08-17 VITALS — BP 122/80 | HR 71 | Temp 98.6°F | Wt 223.0 lb

## 2016-08-17 DIAGNOSIS — I10 Essential (primary) hypertension: Secondary | ICD-10-CM

## 2016-08-17 DIAGNOSIS — B20 Human immunodeficiency virus [HIV] disease: Secondary | ICD-10-CM

## 2016-08-17 DIAGNOSIS — R739 Hyperglycemia, unspecified: Secondary | ICD-10-CM

## 2016-08-17 DIAGNOSIS — M79604 Pain in right leg: Secondary | ICD-10-CM

## 2016-08-17 DIAGNOSIS — M79605 Pain in left leg: Secondary | ICD-10-CM

## 2016-08-17 HISTORY — DX: Pain in right leg: M79.604

## 2016-08-17 NOTE — Progress Notes (Signed)
Chief complaint: followup for HIV, depression pain in his foot  Subjective:    Patient ID: Jonathan Hendricks, male    DOB: Jan 27, 1961, 56 y.o.   MRN: [id number]  HPI  56 year old with HIV who has managed to control his HIV with Atripla but has suffered development of gynecomastia, and likely worsening temper at work. He had then  lost his job due to having trouble controlling his temper at work.  [facility] but since the change he had been suffering from insomnia.  He has new complaint of pain in his big toe with inability to flex dorsiflex this digit. He also experiences numbness. This has been going on for a month. He also experiences color changes in this toe at times.   Past Medical History:  Diagnosis Date  . Constipation   . Depression   . Emotional lability 07/16/2014  . Gynecomastia 07/16/2014  . HIV disease (HCC)   . Hypertension   . IBD (inflammatory bowel disease)   . Insomnia 08/29/2014  . Pain in joint, shoulder region 06/18/2015  . Rectal pain   . Recurrent major depression-severe (HCC) 07/16/2014    No past surgical history on file.  Family History  Problem Relation Age of Onset  . Heart disease Mother       Social History   Social History  . Marital status: Single    Spouse name: N/A  . Number of children: N/A  . Years of education: N/A   Social History Main Topics  . Smoking status: Never Smoker  . Smokeless tobacco: Never Used  . Alcohol use 0.0 oz/week     Comment: OCCAS.  . Drug use: No  . Sexual activity: Not Currently    Partners: Male     Comment: patient declined condoms   Other Topics Concern  . None   Social History Narrative  . None    No Known Allergies   Current Outpatient Prescriptions:  .  escitalopram (LEXAPRO) 20 MG tablet, Take 1 tablet (20 mg total) by mouth daily., Disp: 30 tablet, Rfl: 11 .  fluconazole (DIFLUCAN) 100 MG tablet, Take 2 tablets (200 mg total) by mouth daily., Disp: 28 tablet, Rfl:  1 .  lisinopril-hydrochlorothiazide (PRINZIDE,ZESTORETIC) 20-25 MG tablet, TAKE 1 TABLET BY MOUTH EVERY DAY, Disp: 90 tablet, Rfl: 3 .  OVER THE COUNTER MEDICATION, otc allergy eye gtts, Disp: , Rfl:  .  triamcinolone (KENALOG) 0.025 % cream, APPLY EXTERNALLY TO THE AFFECTED AREA TWICE DAILY, Disp: 30 g, Rfl: 0 .  TRIUMEQ 600-50-300 MG tablet, TAKE 1 TABLET BY MOUTH DAILY, Disp: 30 tablet, Rfl: 0 .  valACYclovir (VALTREX) 500 MG tablet, TAKE 1 TABLET(500 MG) BY MOUTH DAILY, Disp: 90 tablet, Rfl: 1       Review of Systems  Constitutional: Negative for activity change, appetite change, chills, diaphoresis, fever and unexpected weight change.  HENT: Negative for congestion, rhinorrhea, sinus pressure, sneezing, sore throat and trouble swallowing.   Eyes: Negative for photophobia and visual disturbance.  Respiratory: Negative for cough, chest tightness, shortness of breath, wheezing and stridor.   Cardiovascular: Negative for chest pain, palpitations and leg swelling.  Gastrointestinal: Negative for abdominal distention, abdominal pain, anal bleeding, blood in stool, constipation, nausea and vomiting.  Genitourinary: Negative for difficulty urinating, dysuria, flank pain and hematuria.  Musculoskeletal: Positive for joint swelling and myalgias. Negative for arthralgias, back pain and gait problem.  Skin: Positive for color change and rash. Negative for pallor and  wound.  Neurological: Negative for dizziness, tremors, weakness and light-headedness.  Hematological: Negative for adenopathy. Does not bruise/bleed easily.  Psychiatric/Behavioral: Positive for sleep disturbance. Negative for confusion, hallucinations, self-injury and suicidal ideas.       Objective:   Physical Exam  Constitutional: He is oriented to person, place, and time. He appears well-developed and well-nourished. No distress.  HENT:  Head: Normocephalic and atraumatic.  Mouth/Throat: Oropharynx is clear and moist. No  oropharyngeal exudate.  Eyes: Conjunctivae and EOM are normal. No scleral icterus.  Neck: Normal range of motion. Neck supple.  Cardiovascular: Normal rate, regular rhythm and normal heart sounds.   Pulmonary/Chest: Effort normal. No respiratory distress. He has no wheezes.  Abdominal: Soft. He exhibits no distension. There is no tenderness. There is no rebound.  Musculoskeletal: He exhibits no edema or tenderness.       Feet:  Neurological: He is alert and oriented to person, place, and time. He exhibits normal muscle tone. Coordination normal.  Skin: Skin is warm and dry. He is not diaphoretic. No pallor.  Psychiatric: Judgment normal. His mood appears not anxious. His affect is not angry and not inappropriate. His speech is not rapid and/or pressured, not delayed, not tangential and not slurred. He is not agitated, not aggressive and not hyperactive. Thought content is not paranoid and not delusional. Cognition and memory are not impaired. He does not exhibit a depressed mood. He expresses no homicidal ideation. He expresses no suicidal plans and no homicidal plans. He is communicative.          Assessment & Plan:   HIV:  Continue the   TRIUMEQ  n am.  Severe depression with suicidal thoughts. He is on antidepressants and should be following with Sherri  ARF on CKD:off of TNF now. He still  needs to see a nephrologist. His BP is well controlled now  HTN:  well controlled on hydrochlorothiazide and lisinopril  Vitals:   08/17/16 1116  BP: 122/80  Pulse: 71  Temp: 98.6 F (37 C)   Hyperglycemia: pt concerned about elevated BG. I will check HgA1c  Toe pain: I am checking plain films and  Will refer to podiatry if he can be seen  I spent greater than 40  minutes with the patient including greater than 50% of time in face to face counsel of the patient re his HIV, toe pain  HTN, CKD  and in coordination of his care.

## 2016-08-18 LAB — HEMOGLOBIN A1C
Hgb A1c MFr Bld: 5.7 % — ABNORMAL HIGH (ref <5.7)
Mean Plasma Glucose: 117 mg/dL

## 2016-08-19 ENCOUNTER — Other Ambulatory Visit: Payer: Self-pay | Admitting: Infectious Disease

## 2016-08-19 ENCOUNTER — Encounter (HOSPITAL_COMMUNITY): Payer: Self-pay

## 2016-08-19 ENCOUNTER — Ambulatory Visit (HOSPITAL_COMMUNITY)
Admission: RE | Admit: 2016-08-19 | Discharge: 2016-08-19 | Disposition: A | Discharge status: Home / Self Care | Payer: Self-pay | Source: Ambulatory Visit | Attending: Infectious Disease | Admitting: Infectious Disease

## 2016-08-19 DIAGNOSIS — R52 Pain, unspecified: Secondary | ICD-10-CM

## 2016-08-19 DIAGNOSIS — M79605 Pain in left leg: Secondary | ICD-10-CM | POA: Insufficient documentation

## 2016-08-19 DIAGNOSIS — M19072 Primary osteoarthritis, left ankle and foot: Secondary | ICD-10-CM | POA: Insufficient documentation

## 2016-08-19 DIAGNOSIS — R739 Hyperglycemia, unspecified: Secondary | ICD-10-CM

## 2016-08-19 DIAGNOSIS — M79604 Pain in right leg: Secondary | ICD-10-CM

## 2016-08-19 DIAGNOSIS — M7989 Other specified soft tissue disorders: Secondary | ICD-10-CM | POA: Insufficient documentation

## 2016-08-19 DIAGNOSIS — B20 Human immunodeficiency virus [HIV] disease: Secondary | ICD-10-CM

## 2016-08-21 ENCOUNTER — Telehealth: Payer: Self-pay | Admitting: *Deleted

## 2016-08-21 DIAGNOSIS — M13879 Other specified arthritis, unspecified ankle and foot: Secondary | ICD-10-CM

## 2016-08-21 NOTE — Telephone Encounter (Signed)
Perfect

## 2016-08-21 NOTE — Telephone Encounter (Signed)
-----   Message from Randall Hissornelius N Van Dam, MD sent at 08/19/2016 11:04 PM EDT ----- Can we possibly have him seen by a Rheumatologist?

## 2016-08-21 NOTE — Telephone Encounter (Signed)
Rheumatology referral placed per verbal order from Dr Lennox LaityVan Dam.   Jonathan Hendricks, Zachary GeorgeMichelle M, RN

## 2016-08-24 ENCOUNTER — Telehealth: Payer: Self-pay | Admitting: *Deleted

## 2016-08-24 NOTE — Telephone Encounter (Signed)
Can someone at [facility] see him?

## 2016-08-24 NOTE — Telephone Encounter (Signed)
Patient came to office this morning to find out what the next step after his HemA1C and foot Xray. Please advise as he is worried that he has DM.

## 2016-09-01 ENCOUNTER — Encounter: Payer: Self-pay | Admitting: Infectious Disease

## 2016-09-04 ENCOUNTER — Other Ambulatory Visit: Payer: Self-pay | Admitting: Infectious Disease

## 2016-09-04 DIAGNOSIS — B20 Human immunodeficiency virus [HIV] disease: Secondary | ICD-10-CM

## 2016-09-21 ENCOUNTER — Telehealth: Payer: Self-pay | Admitting: *Deleted

## 2016-09-21 NOTE — Telephone Encounter (Signed)
Patient called very angry about the fact that he was billed for the xray he had on his foot. He advised if he knew he would get a bill he would not have gotten the xray as he has not money to pay. Advised him that he is borderline DM and he would need to follow up with his PCP and if he is still having problems with his feet he would referr him to a pediatrist. Patient advised he can not pay for the xray he will not go he will just have to hurt. I tried to let him know about [facility] and the process and he ended the call. Called him back and he did not answer the line.

## 2016-12-03 ENCOUNTER — Other Ambulatory Visit: Payer: Self-pay

## 2016-12-03 DIAGNOSIS — B20 Human immunodeficiency virus [HIV] disease: Secondary | ICD-10-CM

## 2016-12-03 DIAGNOSIS — M79605 Pain in left leg: Principal | ICD-10-CM

## 2016-12-03 DIAGNOSIS — M79604 Pain in right leg: Secondary | ICD-10-CM

## 2016-12-03 DIAGNOSIS — R739 Hyperglycemia, unspecified: Secondary | ICD-10-CM

## 2016-12-04 LAB — T-HELPER CELL (CD4) - (RCID CLINIC ONLY)
CD4 % Helper T Cell: 27 % — ABNORMAL LOW (ref 33–55)
CD4 T CELL ABS: 1260 /uL (ref 400–2700)

## 2016-12-07 LAB — URINE CYTOLOGY ANCILLARY ONLY
Chlamydia: NEGATIVE
NEISSERIA GONORRHEA: NEGATIVE

## 2016-12-07 LAB — HIV-1 RNA QUANT-NO REFLEX-BLD
HIV 1 RNA QUANT: NOT DETECTED {copies}/mL
HIV-1 RNA QUANT, LOG: NOT DETECTED {Log_copies}/mL

## 2016-12-09 ENCOUNTER — Encounter: Payer: Self-pay | Admitting: Family Medicine

## 2016-12-09 LAB — CBC WITH DIFFERENTIAL/PLATELET
BASOS ABS: 55 {cells}/uL (ref 0–200)
BASOS PCT: 0.6 %
Eosinophils Absolute: 191 cells/uL (ref 15–500)
Eosinophils Relative: 2.1 %
HCT: 45.1 % (ref 38.5–50.0)
HEMOGLOBIN: 16.1 g/dL (ref 13.2–17.1)
Lymphs Abs: 4141 cells/uL — ABNORMAL HIGH (ref 850–3900)
MCH: 32.9 pg (ref 27.0–33.0)
MCHC: 35.7 g/dL (ref 32.0–36.0)
MCV: 92 fL (ref 80.0–100.0)
MONOS PCT: 7.4 %
MPV: 10.5 fL (ref 7.5–12.5)
NEUTROS ABS: 4040 {cells}/uL (ref 1500–7800)
Neutrophils Relative %: 44.4 %
Platelets: 332 10*3/uL (ref 140–400)
RBC: 4.9 10*6/uL (ref 4.20–5.80)
RDW: 12.9 % (ref 11.0–15.0)
Total Lymphocyte: 45.5 %
WBC: 9.1 10*3/uL (ref 3.8–10.8)
WBCMIX: 673 {cells}/uL (ref 200–950)

## 2016-12-09 LAB — LIPID PANEL
Cholesterol: 172 mg/dL (ref <200)
HDL: 30 mg/dL — ABNORMAL LOW (ref >40)
LDL Cholesterol (Calc): 110 mg/dL (calc) — ABNORMAL HIGH
Non-HDL Cholesterol (Calc): 142 mg/dL (calc) — ABNORMAL HIGH (ref <130)
TRIGLYCERIDES: 202 mg/dL — AB (ref <150)
Total CHOL/HDL Ratio: 5.7 (calc) — ABNORMAL HIGH (ref <5.0)

## 2016-12-09 LAB — COMPLETE METABOLIC PANEL WITH GFR
AG RATIO: 1.2 (calc) (ref 1.0–2.5)
ALT: 26 U/L (ref 9–46)
AST: 19 U/L (ref 10–35)
Albumin: 4.1 g/dL (ref 3.6–5.1)
Alkaline phosphatase (APISO): 76 U/L (ref 40–115)
BILIRUBIN TOTAL: 0.4 mg/dL (ref 0.2–1.2)
BUN / CREAT RATIO: 9 (calc) (ref 6–22)
BUN: 13 mg/dL (ref 7–25)
CHLORIDE: 99 mmol/L (ref 98–110)
CO2: 27 mmol/L (ref 20–32)
Calcium: 9.3 mg/dL (ref 8.6–10.3)
Creat: 1.47 mg/dL — ABNORMAL HIGH (ref 0.70–1.33)
GFR, EST AFRICAN AMERICAN: 61 mL/min/{1.73_m2} (ref >=60)
GFR, Est Non African American: 53 mL/min/{1.73_m2} — ABNORMAL LOW (ref >=60)
Globulin: 3.4 g/dL (calc) (ref 1.9–3.7)
Glucose, Bld: 105 mg/dL — ABNORMAL HIGH (ref 65–99)
POTASSIUM: 4.3 mmol/L (ref 3.5–5.3)
Sodium: 135 mmol/L (ref 135–146)
TOTAL PROTEIN: 7.5 g/dL (ref 6.1–8.1)

## 2016-12-09 LAB — RPR: RPR: NONREACTIVE

## 2016-12-10 ENCOUNTER — Ambulatory Visit (HOSPITAL_COMMUNITY)
Admission: EM | Admit: 2016-12-10 | Discharge: 2016-12-10 | Disposition: A | Discharge status: Home / Self Care | Payer: Self-pay | Attending: Emergency Medicine | Admitting: Emergency Medicine

## 2016-12-10 ENCOUNTER — Encounter (HOSPITAL_COMMUNITY): Payer: Self-pay | Admitting: Emergency Medicine

## 2016-12-10 ENCOUNTER — Telehealth (HOSPITAL_COMMUNITY): Payer: Self-pay | Admitting: *Deleted

## 2016-12-10 DIAGNOSIS — K645 Perianal venous thrombosis: Secondary | ICD-10-CM

## 2016-12-10 MED ORDER — LIDOCAINE-EPINEPHRINE (PF) 2 %-1:200000 IJ SOLN
INTRAMUSCULAR | Status: AC
  Filled 2016-12-10: qty 20

## 2016-12-10 MED ORDER — METRONIDAZOLE 500 MG PO TABS: 500.0000 mg | ORAL_TABLET | Freq: Three times a day (TID) | ORAL | 0 refills | Status: DC

## 2016-12-10 MED ORDER — METRONIDAZOLE 500 MG PO TABS: 500.0000 mg | ORAL_TABLET | Freq: Three times a day (TID) | ORAL | 0 refills | Status: AC

## 2016-12-10 NOTE — ED Provider Notes (Signed)
HPI  SUBJECTIVE:  Jonathan Hendricks is a 56 y.o. male who presents with painful rectal mass for 2 weeks. States is identical to previous thrombosed hemorrhoids which he has had incised and drained. He has tried sitz baths, Preparation H, witch hazel, tea tree oil, apple cider vinegar, stool softeners without improvement in his symptoms. Symptoms are worse with having a bowel movement. He denies bleeding. He reports that the itching has resolved. No fevers. He has a past medical history of HIV with CD4 count of 1100, viral load undetectable, no history of diabetes, hypertension. He is not on any antiplatelet or anticoagulant drugs. PMD: Dr. Daiva Eves.   Past Medical History:  Diagnosis Date  . Constipation   . Depression   . Emotional lability 07/16/2014  . Gynecomastia 07/16/2014  . HIV disease (HCC)   . Hypertension   . IBD (inflammatory bowel disease)   . Insomnia 08/29/2014  . Pain in joint, shoulder region 06/18/2015  . Painful legs and moving toes 08/17/2016  . Rectal pain   . Recurrent major depression-severe (HCC) 07/16/2014    History reviewed. No pertinent surgical history.  Family History  Problem Relation Age of Onset  . Heart disease Mother     Social History  Substance Use Topics  . Smoking status: Never Smoker  . Smokeless tobacco: Never Used  . Alcohol use 0.0 oz/week     Comment: OCCAS.    No current facility-administered medications for this encounter.   Current Outpatient Prescriptions:  .  lisinopril-hydrochlorothiazide (PRINZIDE,ZESTORETIC) 20-25 MG tablet, TAKE 1 TABLET BY MOUTH EVERY DAY, Disp: 90 tablet, Rfl: 3 .  TRIUMEQ 600-50-300 MG tablet, TAKE 1 TABLET BY MOUTH DAILY, Disp: 30 tablet, Rfl: 5 .  valACYclovir (VALTREX) 500 MG tablet, TAKE 1 TABLET(500 MG) BY MOUTH DAILY, Disp: 90 tablet, Rfl: 1 .  escitalopram (LEXAPRO) 20 MG tablet, Take 1 tablet (20 mg total) by mouth daily., Disp: 30 tablet, Rfl: 11 .  fluconazole (DIFLUCAN) 100 MG tablet, Take 2 tablets  (200 mg total) by mouth daily., Disp: 28 tablet, Rfl: 1 .  metroNIDAZOLE (FLAGYL) 500 MG tablet, Take 1 tablet (500 mg total) by mouth 3 (three) times daily., Disp: 15 tablet, Rfl: 0 .  OVER THE COUNTER MEDICATION, otc allergy eye gtts, Disp: , Rfl:  .  triamcinolone (KENALOG) 0.025 % cream, APPLY EXTERNALLY TO THE AFFECTED AREA TWICE DAILY, Disp: 30 g, Rfl: 0  No Known Allergies   ROS  As noted in HPI.   Physical Exam  BP 131/87   Pulse 75   Temp 97.9 F (36.6 C) (Oral)   Resp 16   SpO2 100%   Constitutional: Well developed, well nourished, no acute distress Eyes:  EOMI, conjunctiva normal bilaterally HENT: Normocephalic, atraumatic,mucus membranes moist Respiratory: Normal inspiratory effort Cardiovascular: Normal rate GI: nondistended GU: minimally tender thrombosed hemorrhoid at the 9:00 position. no other evidence of hemorrhoids. skin: No rash, skin intact Musculoskeletal: no deformities Neurologic: Alert & oriented x 3, no focal neuro deficits Psychiatric: Speech and behavior appropriate   ED Course   Medications - No data to display  No orders of the defined types were placed in this encounter.   No results found for this or any previous visit (from the past 24 hour(s)). No results found.  ED Clinical Impression  Thrombosed external hemorrhoid 9 ED Assessment/Plan  Procedure note: Cleaned area with iodine and alcohol. Used 0.5 cc of lidocaine 2% with epinephrine for local infiltration with adequate anesthesia. Using an 61  blade,made a single linear incision. Expressed clots. Area felt flat post procedure. Bulky gauze was placed over the area. Patient tolerated procedure well.  We will have him follow-up with [facility] in 24-48 hours to make sure that this has not reaccumulated. Also sending home with Flagyl 500 mg 3 times a day for 5 days because of the history of HIV. In the meantime, local wound care, stool softeners, sitz baths.  Discussed MDM,  plan and followup with patient . Discussed sn/sx that should prompt return to the ED. Patient agrees with plan.   Meds ordered this encounter  Medications  . DISCONTD: metroNIDAZOLE (FLAGYL) 500 MG tablet    Sig: Take 1 tablet (500 mg total) by mouth 3 (three) times daily.    Dispense:  15 tablet    Refill:  0    *This clinic note was created using Scientist, clinical (histocompatibility and immunogenetics)Dragon dictation software. Therefore, there may be occasional mistakes despite careful proofreading.  ?   Domenick GongMortenson, Coulter Oldaker, MD 12/10/16 2116

## 2016-12-10 NOTE — Discharge Instructions (Signed)
Keep the area clean. Continue sitz baths, stool softeners. Jonathan Hendricks. Finish the Flagyl unless your doctor tells you to stop. Follow up with a surgeon at [facility] in 24-48 hours to make sure that this has not reaccumulated and to make sure that you do not need to have it completely excised. Go immediately to the ER for fevers, bodyaches, excessive bleeding, or other concerns.

## 2016-12-10 NOTE — ED Triage Notes (Signed)
Pt states he needs his external hemorrhoid lanced, was told a provider would do that here.

## 2016-12-14 ENCOUNTER — Ambulatory Visit (INDEPENDENT_AMBULATORY_CARE_PROVIDER_SITE_OTHER): Payer: Self-pay | Admitting: Licensed Clinical Social Worker

## 2016-12-14 ENCOUNTER — Encounter: Payer: Self-pay | Admitting: Infectious Disease

## 2016-12-14 ENCOUNTER — Ambulatory Visit (INDEPENDENT_AMBULATORY_CARE_PROVIDER_SITE_OTHER): Payer: Self-pay | Admitting: Infectious Disease

## 2016-12-14 VITALS — BP 125/82 | HR 69 | Temp 98.3°F | Ht 72.0 in | Wt 220.0 lb

## 2016-12-14 DIAGNOSIS — I10 Essential (primary) hypertension: Secondary | ICD-10-CM

## 2016-12-14 DIAGNOSIS — Z79899 Other long term (current) drug therapy: Secondary | ICD-10-CM

## 2016-12-14 DIAGNOSIS — IMO0001 Reserved for inherently not codable concepts without codable children: Secondary | ICD-10-CM | POA: Insufficient documentation

## 2016-12-14 DIAGNOSIS — F919 Conduct disorder, unspecified: Secondary | ICD-10-CM

## 2016-12-14 DIAGNOSIS — N183 Chronic kidney disease, stage 3 unspecified: Secondary | ICD-10-CM

## 2016-12-14 DIAGNOSIS — Z113 Encounter for screening for infections with a predominantly sexual mode of transmission: Secondary | ICD-10-CM

## 2016-12-14 DIAGNOSIS — F529 Unspecified sexual dysfunction not due to a substance or known physiological condition: Secondary | ICD-10-CM | POA: Insufficient documentation

## 2016-12-14 DIAGNOSIS — Z23 Encounter for immunization: Secondary | ICD-10-CM

## 2016-12-14 DIAGNOSIS — B20 Human immunodeficiency virus [HIV] disease: Secondary | ICD-10-CM

## 2016-12-14 DIAGNOSIS — F332 Major depressive disorder, recurrent severe without psychotic features: Secondary | ICD-10-CM

## 2016-12-14 DIAGNOSIS — F321 Major depressive disorder, single episode, moderate: Secondary | ICD-10-CM

## 2016-12-14 HISTORY — DX: Unspecified sexual dysfunction not due to a substance or known physiological condition: F52.9
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
## 2016-12-15 ENCOUNTER — Encounter: Payer: Self-pay | Admitting: Family Medicine

## 2016-12-15 ENCOUNTER — Ambulatory Visit: Payer: Self-pay | Admitting: Infectious Disease
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
## 2016-12-17 ENCOUNTER — Ambulatory Visit: Payer: Self-pay | Admitting: Infectious Disease

## 2016-12-21 ENCOUNTER — Ambulatory Visit (INDEPENDENT_AMBULATORY_CARE_PROVIDER_SITE_OTHER): Payer: Self-pay | Admitting: Licensed Clinical Social Worker

## 2016-12-21 DIAGNOSIS — F919 Conduct disorder, unspecified: Secondary | ICD-10-CM

## 2016-12-21 DIAGNOSIS — F321 Major depressive disorder, single episode, moderate: Secondary | ICD-10-CM

## 2016-12-21 NOTE — BH Specialist Note (Signed)
Integrated Behavioral Health Follow Up Visit  MRN: [id number] Name: Jonathan Gammonhomas F Fontes  Number of Integrated Behavioral Health Clinician visits: 2/6 Session Start time: 8:53 am  Session End time: 9:50am Total time: 1 hour  Type of Service: Integrated Behavioral Health- Individual/Family Interpretor:No. Interpretor Name and Language: N/A   SUBJECTIVE: Jonathan Hendricks is a 56 y.o. male accompanied by self Patient was referred by Dr. Daiva EvesVan Dam for depressive symptoms and anger management issues.  Patient reviewed his behavior for the past week and reported two episodes of angry driving.  Patient completed the Anger Coping Skills Pre-Test and reported handling change well and standing up for himself but also denied having relaxation skills, denied crying and having emotional responses, and reported having low frustration tolerance.  Patient was able to provide context and was receptive to the fact that he does have strengths and has been able to avoid conflict by walking away.  Patient completed the Physical Cues to Anger worksheet and was able to identify his physical symptoms of anger and place the list in order of what occurs first.  Patient was receptive to learning to be on the look out for the first few initial physical symptoms and that intervening early can be a way to avoid explosive anger.  Patient then was able to list his symptoms in order on a Anger Thermometer.  Duration of problem: years; Severity of problem: moderate  OBJECTIVE: Mood: Anxious and Depressed and Affect: Blunt Risk of harm to self or others: No plan to harm self or others  LIFE CONTEXT: Family and Social: Patient reported that his family is deceased and he lives with a roommate who is a former partner. School/Work: Patient reported that he does not work because he cannot get along socially with others, using relaxation, communication, and conflict resolutions skills.  ASSESSMENT: Patient is currently experiencing  depressed mood and episodes of verbal aggression, physical aggression, rage, and severe persistent irritability and may benefit from learning coping strategies, anger management techniques, and medication management.  GOALS ADDRESSED: Patient will: 1.  Reduce symptoms of: depression and irritability and aggression  2.  Increase knowledge and/or ability of: coping skills, self-management skills and stress reduction  3.  Demonstrate ability to: Increase healthy adjustment to current life circumstances and Utilize relaxation and communication skills and anger management techniques  INTERVENTIONS: Interventions utilized:  Solution-Focused Strategies  PLAN: 1. Follow up with behavioral health clinician on : Wed Nov 7th at 8:45 am 2. At next session will use Anger Thermometer in scenarios, learn relaxation skills, complete assessments, and process the role of anger in depression   Vergia AlbertsSherry Pattricia Weiher, [facility]

## 2016-12-30 ENCOUNTER — Telehealth: Payer: Self-pay | Admitting: Licensed Clinical Social Worker

## 2016-12-30 ENCOUNTER — Ambulatory Visit: Payer: Self-pay | Admitting: Licensed Clinical Social Worker

## 2016-12-30 NOTE — Telephone Encounter (Signed)
[facility] missed appointment today and need to reschedule.  Vergia AlbertsSherry Tanayah Squitieri, [facility]

## 2017-02-11 ENCOUNTER — Telehealth: Payer: Self-pay | Admitting: *Deleted

## 2017-02-11 DIAGNOSIS — N529 Male erectile dysfunction, unspecified: Secondary | ICD-10-CM

## 2017-02-11 NOTE — Telephone Encounter (Signed)
Patient called and asked if he could get a Rx for generic viagra. If so he needs a [facility] 30 [facility] [place] he understands that it will be a 90 day supply. Advised him will have to ask his provider and get back to him.

## 2017-02-11 NOTE — Telephone Encounter (Signed)
He can start on 50mg  on ehour before sex. If that does not work we can escalate dose

## 2017-02-12 MED ORDER — SILDENAFIL CITRATE 50 MG PO TABS: 50.0000 mg | ORAL_TABLET | Freq: Every day | ORAL | 1 refills | Status: DC | PRN

## 2017-02-12 NOTE — Addendum Note (Signed)
Addended by: Lurlean LeydenPOOLE, TRAVIS F on: 02/12/2017 08:52 AM   Modules accepted: Orders

## 2017-02-25 ENCOUNTER — Ambulatory Visit: Payer: Self-pay

## 2017-02-25 ENCOUNTER — Other Ambulatory Visit: Payer: Self-pay | Admitting: *Deleted

## 2017-02-25 DIAGNOSIS — I10 Essential (primary) hypertension: Secondary | ICD-10-CM

## 2017-02-25 MED ORDER — LISINOPRIL-HYDROCHLOROTHIAZIDE 20-25 MG PO TABS: 1.0000 | ORAL_TABLET | Freq: Every day | ORAL | 3 refills | Status: DC

## 2017-03-09 ENCOUNTER — Other Ambulatory Visit: Payer: Self-pay

## 2017-03-09 DIAGNOSIS — IMO0001 Reserved for inherently not codable concepts without codable children: Secondary | ICD-10-CM

## 2017-03-09 DIAGNOSIS — I10 Essential (primary) hypertension: Secondary | ICD-10-CM

## 2017-03-09 DIAGNOSIS — F529 Unspecified sexual dysfunction not due to a substance or known physiological condition: Secondary | ICD-10-CM

## 2017-03-09 DIAGNOSIS — Z113 Encounter for screening for infections with a predominantly sexual mode of transmission: Secondary | ICD-10-CM

## 2017-03-09 DIAGNOSIS — Z79899 Other long term (current) drug therapy: Secondary | ICD-10-CM

## 2017-03-09 DIAGNOSIS — N183 Chronic kidney disease, stage 3 unspecified: Secondary | ICD-10-CM

## 2017-03-09 DIAGNOSIS — B20 Human immunodeficiency virus [HIV] disease: Secondary | ICD-10-CM

## 2017-03-09 DIAGNOSIS — Z23 Encounter for immunization: Secondary | ICD-10-CM

## 2017-03-10 LAB — CBC WITH DIFFERENTIAL/PLATELET
Basophils Absolute: 51 cells/uL (ref 0–200)
Basophils Relative: 0.6 %
Eosinophils Absolute: 170 cells/uL (ref 15–500)
Eosinophils Relative: 2 %
HCT: 46.4 % (ref 38.5–50.0)
Hemoglobin: 16.4 g/dL (ref 13.2–17.1)
Lymphs Abs: 4012 cells/uL — ABNORMAL HIGH (ref 850–3900)
MCH: 32.9 pg (ref 27.0–33.0)
MCHC: 35.3 g/dL (ref 32.0–36.0)
MCV: 93 fL (ref 80.0–100.0)
MPV: 10.6 fL (ref 7.5–12.5)
Monocytes Relative: 8.1 %
Neutro Abs: 3579 cells/uL (ref 1500–7800)
Neutrophils Relative %: 42.1 %
Platelets: 331 10*3/uL (ref 140–400)
RBC: 4.99 10*6/uL (ref 4.20–5.80)
RDW: 12.6 % (ref 11.0–15.0)
Total Lymphocyte: 47.2 %
WBC mixed population: 689 cells/uL (ref 200–950)
WBC: 8.5 10*3/uL (ref 3.8–10.8)

## 2017-03-10 LAB — COMPLETE METABOLIC PANEL WITH GFR
AG RATIO: 1.3 (calc) (ref 1.0–2.5)
ALT: 33 U/L (ref 9–46)
AST: 19 U/L (ref 10–35)
Albumin: 4.3 g/dL (ref 3.6–5.1)
Alkaline phosphatase (APISO): 71 U/L (ref 40–115)
BILIRUBIN TOTAL: 1 mg/dL (ref 0.2–1.2)
BUN/Creatinine Ratio: 8 (calc) (ref 6–22)
BUN: 13 mg/dL (ref 7–25)
CALCIUM: 10 mg/dL (ref 8.6–10.3)
CHLORIDE: 98 mmol/L (ref 98–110)
CO2: 30 mmol/L (ref 20–32)
Creat: 1.62 mg/dL — ABNORMAL HIGH (ref 0.70–1.33)
GFR, EST AFRICAN AMERICAN: 54 mL/min/{1.73_m2} — AB (ref >=60)
GFR, Est Non African American: 47 mL/min/{1.73_m2} — ABNORMAL LOW (ref >=60)
GLUCOSE: 115 mg/dL — AB (ref 65–99)
Globulin: 3.4 g/dL (calc) (ref 1.9–3.7)
POTASSIUM: 4.2 mmol/L (ref 3.5–5.3)
Sodium: 138 mmol/L (ref 135–146)
Total Protein: 7.7 g/dL (ref 6.1–8.1)

## 2017-03-10 LAB — RPR: RPR: NONREACTIVE

## 2017-03-10 LAB — LIPID PANEL
Cholesterol: 177 mg/dL (ref <200)
HDL: 29 mg/dL — ABNORMAL LOW (ref >40)
LDL CHOLESTEROL (CALC): 111 mg/dL — AB
NON-HDL CHOLESTEROL (CALC): 148 mg/dL — AB (ref <130)
Total CHOL/HDL Ratio: 6.1 (calc) — ABNORMAL HIGH (ref <5.0)
Triglycerides: 261 mg/dL — ABNORMAL HIGH (ref <150)

## 2017-03-10 LAB — URINE CYTOLOGY ANCILLARY ONLY
CHLAMYDIA, DNA PROBE: NEGATIVE
Neisseria Gonorrhea: NEGATIVE

## 2017-03-10 LAB — T-HELPER CELL (CD4) - (RCID CLINIC ONLY)
CD4 T CELL HELPER: 23 % — AB (ref 33–55)
CD4 T Cell Abs: 1030 /uL (ref 400–2700)

## 2017-03-11 LAB — HIV-1 RNA QUANT-NO REFLEX-BLD
HIV 1 RNA QUANT: NOT DETECTED {copies}/mL
HIV-1 RNA Quant, Log: <1.3 Log copies/mL

## 2017-04-01 ENCOUNTER — Telehealth: Payer: Self-pay | Admitting: Behavioral Health

## 2017-04-01 NOTE — Telephone Encounter (Signed)
Patient called and left a message asking for results of labs he got done 03/09/2017.  Writer called him back and patient was specifically asking for results of his G/C urine and RPR.  Patient was informed these results were negative.  Patient verbalized understanding.

## 2017-04-06 ENCOUNTER — Encounter: Payer: Self-pay | Admitting: Infectious Disease

## 2017-04-07 ENCOUNTER — Other Ambulatory Visit: Payer: Self-pay

## 2017-04-07 DIAGNOSIS — B20 Human immunodeficiency virus [HIV] disease: Secondary | ICD-10-CM

## 2017-04-07 DIAGNOSIS — Z79899 Other long term (current) drug therapy: Secondary | ICD-10-CM

## 2017-04-07 LAB — COMPLETE METABOLIC PANEL WITH GFR
AG Ratio: 1.2 (calc) (ref 1.0–2.5)
ALT: 24 U/L (ref 9–46)
AST: 18 U/L (ref 10–35)
Albumin: 4.3 g/dL (ref 3.6–5.1)
Alkaline phosphatase (APISO): 73 U/L (ref 40–115)
BUN/Creatinine Ratio: 8 (calc) (ref 6–22)
BUN: 12 mg/dL (ref 7–25)
CO2: 29 mmol/L (ref 20–32)
CREATININE: 1.47 mg/dL — AB (ref 0.70–1.33)
Calcium: 9.6 mg/dL (ref 8.6–10.3)
Chloride: 99 mmol/L (ref 98–110)
GFR, EST AFRICAN AMERICAN: 61 mL/min/{1.73_m2} (ref >=60)
GFR, EST NON AFRICAN AMERICAN: 53 mL/min/{1.73_m2} — AB (ref >=60)
GLUCOSE: 144 mg/dL — AB (ref 65–99)
Globulin: 3.7 g/dL (calc) (ref 1.9–3.7)
Potassium: 4.1 mmol/L (ref 3.5–5.3)
Sodium: 136 mmol/L (ref 135–146)
TOTAL PROTEIN: 8 g/dL (ref 6.1–8.1)
Total Bilirubin: 0.8 mg/dL (ref 0.2–1.2)

## 2017-04-07 NOTE — Progress Notes (Unsigned)
Pt had labs drawn 03/09/17, but was scheduled for additional labs today. RN entered CMP and lipid (if fasting) orders. Andree CossHowell, Marguis Mathieson M, RN

## 2017-04-21 ENCOUNTER — Encounter: Payer: Self-pay | Admitting: Infectious Disease

## 2017-04-21 ENCOUNTER — Ambulatory Visit (INDEPENDENT_AMBULATORY_CARE_PROVIDER_SITE_OTHER): Payer: Self-pay | Admitting: Infectious Disease

## 2017-04-21 VITALS — BP 104/71 | HR 70 | Temp 98.1°F | Ht 72.0 in | Wt 213.0 lb

## 2017-04-21 DIAGNOSIS — Z79899 Other long term (current) drug therapy: Secondary | ICD-10-CM

## 2017-04-21 DIAGNOSIS — F529 Unspecified sexual dysfunction not due to a substance or known physiological condition: Secondary | ICD-10-CM

## 2017-04-21 DIAGNOSIS — F332 Major depressive disorder, recurrent severe without psychotic features: Secondary | ICD-10-CM

## 2017-04-21 DIAGNOSIS — Z113 Encounter for screening for infections with a predominantly sexual mode of transmission: Secondary | ICD-10-CM

## 2017-04-21 DIAGNOSIS — N183 Chronic kidney disease, stage 3 unspecified: Secondary | ICD-10-CM

## 2017-04-21 DIAGNOSIS — I10 Essential (primary) hypertension: Secondary | ICD-10-CM

## 2017-04-21 DIAGNOSIS — IMO0001 Reserved for inherently not codable concepts without codable children: Secondary | ICD-10-CM

## 2017-04-21 DIAGNOSIS — B20 Human immunodeficiency virus [HIV] disease: Secondary | ICD-10-CM

## 2017-04-21 NOTE — Progress Notes (Signed)
Chief complaint: followup for HIV, depression, CKD  Subjective:    Patient ID: Jonathan Hendricks F Olejniczak, male    DOB: Mar 11, 1960, 57 y.o.   MRN: [id number]  HPI  57 year old with HIV who has managed to control his HIV with Atripla but has suffered development of gynecomastia, and likely worsening temper at work. He had then  lost his job due to having trouble controlling his temper at work.  [facility] but he then had trouble with insomnia, better since change to am dose   Jonathan Hendricks continues to maintain perfect virologic suppression on TRIUMEQ.  We reviewed all of his labs today.  He did not feel that Jonathan Hendricks addressed his depression as he would have liked. She did address his anger issues but he feels that the latter is related to the former.  Lab Results  Component Value Date   HIV1RNAQUANT <20 NOT DETECTED 03/09/2017   HIV1RNAQUANT <20 NOT DETECTED 12/03/2016   HIV1RNAQUANT 28 (H) 05/26/2016   Lab Results  Component Value Date   CD4TABS 1,030 03/09/2017   CD4TABS 1,260 12/03/2016   CD4TABS 1,090 05/26/2016       Past Medical History:  Diagnosis Date  . Constipation   . Depression   . Emotional lability 07/16/2014  . Gynecomastia 07/16/2014  . HIV disease (HCC)   . Hypertension   . IBD (inflammatory bowel disease)   . Insomnia 08/29/2014  . Pain in joint, shoulder region 06/18/2015  . Painful legs and moving toes 08/17/2016  . Rectal pain   . Recurrent major depression-severe (HCC) 07/16/2014  . Sexual problems 12/14/2016    No past surgical history on file.  Family History  Problem Relation Age of Onset  . Heart disease Mother       Social History   Socioeconomic History  . Marital status: Single    Spouse name: None  . Number of children: None  . Years of education: None  . Highest education level: None  Social Needs  . Financial resource strain: None  . Food insecurity - worry: None  . Food insecurity - inability: None  . Transportation  needs - medical: None  . Transportation needs - non-medical: None  Occupational History  . None  Tobacco Use  . Smoking status: Never Smoker  . Smokeless tobacco: Never Used  Substance and Sexual Activity  . Alcohol use: Yes    Alcohol/week: 0.0 oz    Comment: OCCAS.  . Drug use: No  . Sexual activity: Not Currently    Partners: Male    Comment: patient declined condoms  Other Topics Concern  . None  Social History Narrative  . None    No Known Allergies   Current Outpatient Medications:  .  lisinopril-hydrochlorothiazide (PRINZIDE,ZESTORETIC) 20-25 MG tablet, Take 1 tablet by mouth daily., Disp: 90 tablet, Rfl: 3 .  TRIUMEQ 600-50-300 MG tablet, TAKE 1 TABLET BY MOUTH DAILY, Disp: 30 tablet, Rfl: 5 .  valACYclovir (VALTREX) 500 MG tablet, TAKE 1 TABLET(500 MG) BY MOUTH DAILY, Disp: 90 tablet, Rfl: 1 .  escitalopram (LEXAPRO) 20 MG tablet, Take 1 tablet (20 mg total) by mouth daily. (Patient not taking: Reported on 04/21/2017), Disp: 30 tablet, Rfl: 11 .  OVER THE COUNTER MEDICATION, otc allergy eye gtts, Disp: , Rfl:  .  sildenafil (VIAGRA) 50 MG tablet, Take 1 tablet (50 mg total) by mouth daily as needed for erectile dysfunction., Disp: 30 tablet, Rfl: 1 .  triamcinolone (KENALOG) 0.025 % cream,  APPLY EXTERNALLY TO THE AFFECTED AREA TWICE DAILY (Patient not taking: Reported on 04/21/2017), Disp: 30 g, Rfl: 0       Review of Systems  Constitutional: Negative for activity change, appetite change, chills, diaphoresis, fever and unexpected weight change.  HENT: Negative for congestion, rhinorrhea, sinus pressure, sneezing, sore throat and trouble swallowing.   Eyes: Negative for photophobia and visual disturbance.  Respiratory: Negative for cough, chest tightness, shortness of breath, wheezing and stridor.   Cardiovascular: Negative for chest pain, palpitations and leg swelling.  Gastrointestinal: Negative for abdominal distention, abdominal pain, anal bleeding, blood in  stool, constipation, nausea and vomiting.  Genitourinary: Negative for difficulty urinating, dysuria, flank pain and hematuria.  Musculoskeletal: Negative for arthralgias, back pain, gait problem, joint swelling and myalgias.  Skin: Negative for pallor and wound.  Neurological: Negative for dizziness, tremors, weakness and light-headedness.  Hematological: Negative for adenopathy. Does not bruise/bleed easily.  Psychiatric/Behavioral: Positive for dysphoric mood. Negative for behavioral problems, confusion, decreased concentration, hallucinations, self-injury, sleep disturbance and suicidal ideas.       Objective:   Physical Exam  Constitutional: He is oriented to person, place, and time. He appears well-developed and well-nourished. No distress.  HENT:  Head: Normocephalic and atraumatic.  Mouth/Throat: Oropharynx is clear and moist. No oropharyngeal exudate.  Eyes: Conjunctivae and EOM are normal. No scleral icterus.  Neck: Normal range of motion. Neck supple.  Cardiovascular: Normal rate, regular rhythm and normal heart sounds.  Pulmonary/Chest: Effort normal. No respiratory distress. He has no wheezes.  Abdominal: Soft. He exhibits no distension. There is no tenderness. There is no rebound.  Musculoskeletal: He exhibits no edema or tenderness.  Neurological: He is alert and oriented to person, place, and time. He exhibits normal muscle tone. Coordination normal.  Skin: Skin is warm and dry. He is not diaphoretic. No pallor.  Psychiatric: His behavior is normal. Judgment normal. His mood appears anxious. His affect is not angry and not inappropriate. His speech is not rapid and/or pressured, not delayed, not tangential and not slurred. He is not agitated, not aggressive and not hyperactive. Thought content is not paranoid and not delusional. Cognition and memory are normal. Cognition and memory are not impaired. He does not exhibit a depressed mood. He expresses no homicidal ideation. He  expresses no suicidal plans and no homicidal plans. He is communicative.  Nursing note and vitals reviewed.         Assessment & Plan:   HIV:  Continue the   TRIUMEQ  And check labs and renew ADAP in July  Severe depression with prior suicidal thougts but not currently. I rx SSRI. Get him plugged into new counselor  ARF on CKD:off of TNF now. BP better now and I suspect this was more the issue than TDF. HIs cr bumped in January but now back down  HTN:  well controlled on hydrochlorothiazide and lisinopril  There were no vitals filed for this visit.  Sexual problems: on viagra  I spent greater than 25 minutes with the patient including greater than 50% of time in face to face counsel of the patient re CV risk and calculating this and reviewing modifiable risk factors and considering statin therapy and in coordination of his care.

## 2017-04-29 ENCOUNTER — Other Ambulatory Visit: Payer: Self-pay | Admitting: Infectious Disease

## 2017-04-29 DIAGNOSIS — B20 Human immunodeficiency virus [HIV] disease: Secondary | ICD-10-CM

## 2017-05-27 ENCOUNTER — Other Ambulatory Visit: Payer: Self-pay | Admitting: Infectious Disease

## 2017-05-27 DIAGNOSIS — B20 Human immunodeficiency virus [HIV] disease: Secondary | ICD-10-CM

## 2017-07-22 ENCOUNTER — Other Ambulatory Visit: Payer: Self-pay | Admitting: Infectious Disease

## 2017-08-10 ENCOUNTER — Other Ambulatory Visit: Payer: Self-pay

## 2017-08-10 DIAGNOSIS — Z79899 Other long term (current) drug therapy: Secondary | ICD-10-CM

## 2017-08-10 DIAGNOSIS — I10 Essential (primary) hypertension: Secondary | ICD-10-CM

## 2017-08-10 DIAGNOSIS — N183 Chronic kidney disease, stage 3 unspecified: Secondary | ICD-10-CM

## 2017-08-10 DIAGNOSIS — Z113 Encounter for screening for infections with a predominantly sexual mode of transmission: Secondary | ICD-10-CM

## 2017-08-10 DIAGNOSIS — B20 Human immunodeficiency virus [HIV] disease: Secondary | ICD-10-CM

## 2017-08-10 DIAGNOSIS — F332 Major depressive disorder, recurrent severe without psychotic features: Secondary | ICD-10-CM

## 2017-08-11 LAB — T-HELPER CELL (CD4) - (RCID CLINIC ONLY)
CD4 % Helper T Cell: 28 % — ABNORMAL LOW (ref 33–55)
CD4 T CELL ABS: 1150 /uL (ref 400–2700)

## 2017-08-12 LAB — HIV-1 RNA QUANT-NO REFLEX-BLD
HIV 1 RNA QUANT: NOT DETECTED {copies}/mL
HIV-1 RNA QUANT, LOG: NOT DETECTED {Log_copies}/mL

## 2017-08-24 ENCOUNTER — Ambulatory Visit: Payer: Self-pay

## 2017-08-24 ENCOUNTER — Ambulatory Visit (INDEPENDENT_AMBULATORY_CARE_PROVIDER_SITE_OTHER): Payer: Self-pay | Admitting: Infectious Disease

## 2017-08-24 ENCOUNTER — Encounter: Payer: Self-pay | Admitting: Infectious Disease

## 2017-08-24 VITALS — BP 119/79 | HR 61 | Temp 97.7°F | Wt 199.0 lb

## 2017-08-24 DIAGNOSIS — B20 Human immunodeficiency virus [HIV] disease: Secondary | ICD-10-CM

## 2017-08-24 DIAGNOSIS — I10 Essential (primary) hypertension: Secondary | ICD-10-CM

## 2017-08-24 DIAGNOSIS — F332 Major depressive disorder, recurrent severe without psychotic features: Secondary | ICD-10-CM

## 2017-08-24 DIAGNOSIS — N183 Chronic kidney disease, stage 3 unspecified: Secondary | ICD-10-CM

## 2017-08-24 DIAGNOSIS — Z23 Encounter for immunization: Secondary | ICD-10-CM

## 2017-08-24 HISTORY — DX: Encounter for immunization: Z23

## 2017-08-24 NOTE — Progress Notes (Signed)
Chief complaint: followup for HIV, depression, CKD, he is concerned about commercials on TV about lawsuits for [employer]  Subjective:    Patient ID: Jonathan Hendricks, male    DOB: May 16, 1960, 57 y.o.   MRN: [id number]  HPI  57 year old with HIV who has managed to control his HIV with Atripla but has suffered development of gynecomastia, and likely worsening temper at work. He had then  lost his job due to having trouble controlling his temper at work.  We changed him over to [facility] but he then had trouble with insomnia, better since change to am dose   Jonathan Hendricks continues to maintain perfect virologic suppression on TRIUMEQ.  Day he was concerned about recent commercials he had seen on TV alleging link between antiretrovirals and what he thought was liver and kidney disease.  I suspect these were commercials with regards to TDF and risk for knee and bone damage.  I told him that first of all he was not on TDF.  I also reinforced that TDF was 1 of the absolute best drugs we had to treat HIV for very long time until the newer formulation of TAF became available.  So voiced my concerns about the effect that these commercials and lawsuits will have on potential adherence of patients living with HIV to the medications are patients at risk for having HIV at not taking preexposure prophylaxis which is currently only available through [employer]  Lab Results  Component Value Date   HIV1RNAQUANT <20 NOT DETECTED 08/10/2017   HIV1RNAQUANT <20 NOT DETECTED 03/09/2017   HIV1RNAQUANT <20 NOT DETECTED 12/03/2016   Lab Results  Component Value Date   CD4TABS 1,150 08/10/2017   CD4TABS 1,030 03/09/2017   CD4TABS 1,260 12/03/2016       Past Medical History:  Diagnosis Date  . Constipation   . Depression   . Emotional lability 07/16/2014  . Gynecomastia 07/16/2014  . HIV disease (HCC)   . Hypertension   . IBD (inflammatory bowel disease)   . Insomnia 08/29/2014  . Pain in joint, shoulder region  06/18/2015  . Painful legs and moving toes 08/17/2016  . Rectal pain   . Recurrent major depression-severe (HCC) 07/16/2014  . Sexual problems 12/14/2016    No past surgical history on file.  Family History  Problem Relation Age of Onset  . Heart disease Mother       Social History   Socioeconomic History  . Marital status: Single    Spouse name: Not on file  . Number of children: Not on file  . Years of education: Not on file  . Highest education level: Not on file  Occupational History  . Not on file  Social Needs  . Financial resource strain: Not on file  . Food insecurity:    Worry: Not on file    Inability: Not on file  . Transportation needs:    Medical: Not on file    Non-medical: Not on file  Tobacco Use  . Smoking status: Never Smoker  . Smokeless tobacco: Never Used  Substance and Sexual Activity  . Alcohol use: Yes    Alcohol/week: 0.0 oz    Comment: OCCAS.  . Drug use: No  . Sexual activity: Not Currently    Partners: Male    Comment: patient declined condoms  Lifestyle  . Physical activity:    Days per week: Not on file    Minutes per session: Not on file  . Stress: Not on file  Relationships  . Social connections:    Talks on phone: Not on file    Gets together: Not on file    Attends religious service: Not on file    Active member of club or organization: Not on file    Attends meetings of clubs or organizations: Not on file    Relationship status: Not on file  Other Topics Concern  . Not on file  Social History Narrative  . Not on file    No Known Allergies   Current Outpatient Medications:  .  lisinopril-hydrochlorothiazide (PRINZIDE,ZESTORETIC) 20-25 MG tablet, Take 1 tablet by mouth daily., Disp: 90 tablet, Rfl: 3 .  sildenafil (VIAGRA) 50 MG tablet, Take 1 tablet (50 mg total) by mouth daily as needed for erectile dysfunction., Disp: 30 tablet, Rfl: 1 .  TRIUMEQ 600-50-300 MG tablet, TAKE 1 TABLET BY MOUTH DAILY, Disp: 30 tablet,  Rfl: 5 .  valACYclovir (VALTREX) 500 MG tablet, TAKE 1 TABLET(500 MG) BY MOUTH DAILY, Disp: 90 tablet, Rfl: 3 .  escitalopram (LEXAPRO) 20 MG tablet, Take 1 tablet (20 mg total) by mouth daily. (Patient not taking: Reported on 04/21/2017), Disp: 30 tablet, Rfl: 11 .  OVER THE COUNTER MEDICATION, otc allergy eye gtts, Disp: , Rfl:  .  triamcinolone (KENALOG) 0.025 % cream, APPLY EXTERNALLY TO THE AFFECTED AREA TWICE DAILY (Patient not taking: Reported on 04/21/2017), Disp: 30 g, Rfl: 0       Review of Systems  Constitutional: Negative for activity change, appetite change, chills, diaphoresis, fever and unexpected weight change.  HENT: Negative for congestion, rhinorrhea, sinus pressure, sneezing, sore throat and trouble swallowing.   Eyes: Negative for photophobia and visual disturbance.  Respiratory: Negative for cough, chest tightness, shortness of breath, wheezing and stridor.   Cardiovascular: Negative for chest pain, palpitations and leg swelling.  Gastrointestinal: Negative for abdominal distention, abdominal pain, anal bleeding, blood in stool, constipation, nausea and vomiting.  Genitourinary: Negative for difficulty urinating, dysuria, flank pain and hematuria.  Musculoskeletal: Negative for arthralgias, back pain, gait problem, joint swelling and myalgias.  Skin: Negative for pallor and wound.  Neurological: Negative for dizziness, tremors, weakness and light-headedness.  Hematological: Negative for adenopathy. Does not bruise/bleed easily.  Psychiatric/Behavioral: Positive for dysphoric mood. Negative for behavioral problems, confusion, decreased concentration, hallucinations, self-injury, sleep disturbance and suicidal ideas.       Objective:   Physical Exam  Constitutional: He is oriented to person, place, and time. He appears well-developed and well-nourished. No distress.  HENT:  Head: Normocephalic and atraumatic.  Mouth/Throat: Oropharynx is clear and moist. No  oropharyngeal exudate.  Eyes: Conjunctivae and EOM are normal. No scleral icterus.  Neck: Normal range of motion. Neck supple.  Cardiovascular: Normal rate, regular rhythm and normal heart sounds.  Pulmonary/Chest: Effort normal. No respiratory distress. He has no wheezes.  Abdominal: Soft. He exhibits no distension. There is no tenderness. There is no rebound.  Musculoskeletal: He exhibits no edema or tenderness.  Neurological: He is alert and oriented to person, place, and time. He exhibits normal muscle tone. Coordination normal.  Skin: Skin is warm and dry. He is not diaphoretic. No pallor.  Psychiatric: He has a normal mood and affect. His behavior is normal. Judgment and thought content normal. His affect is not angry and not inappropriate. His speech is not rapid and/or pressured, not delayed, not tangential and not slurred. He is not agitated, not aggressive and not hyperactive. Thought content is not paranoid and not delusional. Cognition and memory are normal.  Cognition and memory are not impaired. He does not exhibit a depressed mood. He expresses no homicidal ideation. He expresses no suicidal plans and no homicidal plans. He is communicative.  Nursing note and vitals reviewed.         Assessment & Plan:   HIV:  Continue the   TRIUMEQ  And check labs and renew ADAP in January.  Severe depression with prior suicidal thougts but not currently.  He did not want a selective serotonin reuptake inhibitor or any other antidepressant prescribed at present but would think about it he also did not want to meet with a counselor but will think about it.  ARF on CKD:off of TNF now. BP better now and I suspect this was more the issue than previous TDF.  HTN:  well controlled on hydrochlorothiazide and lisinopril  Vitals:   08/24/17 0913  BP: 119/79  Pulse: 61  Temp: 97.7 F (36.5 C)    Sexual problems: on viagra  I spent greater than 25 minutes with the patient including greater  than 50% of time in face to face counsel of the patient regarding different antiretroviral regimens including talking about a block of ear and cardiovascular risk and the controversy surrounding this potential association the information about TDF and risk for bone and kidney disease, various regimens that he could be changed to and in coordination of his care.  Note after the visit was completed he told Jonathan Hendricks about having left-sided arm numbness for several months and proceeded to go to the urgent care.

## 2017-08-25 ENCOUNTER — Encounter: Payer: Self-pay | Admitting: Infectious Disease

## 2017-09-09 IMAGING — CR DG FOOT COMPLETE 3+V*L*
3 series · 3 of 3 positions shown · non-contrast
Comparison: None.

CLINICAL DATA: Nonfocal pain and swelling in the left foot for 6
months. No reported injury.

EXAM:
LEFT FOOT - COMPLETE 3+ VIEW

[foot ap]
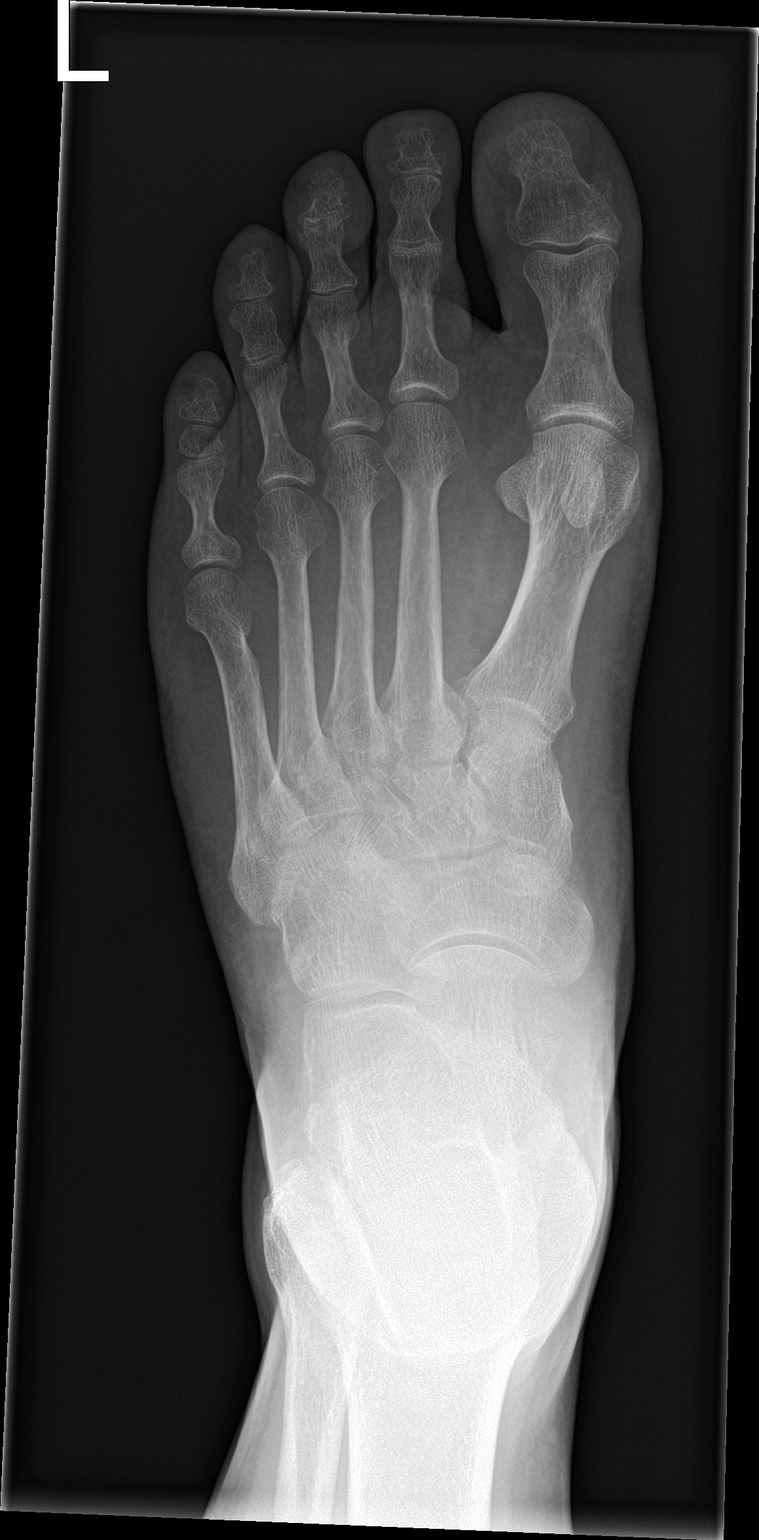

[foot obl]
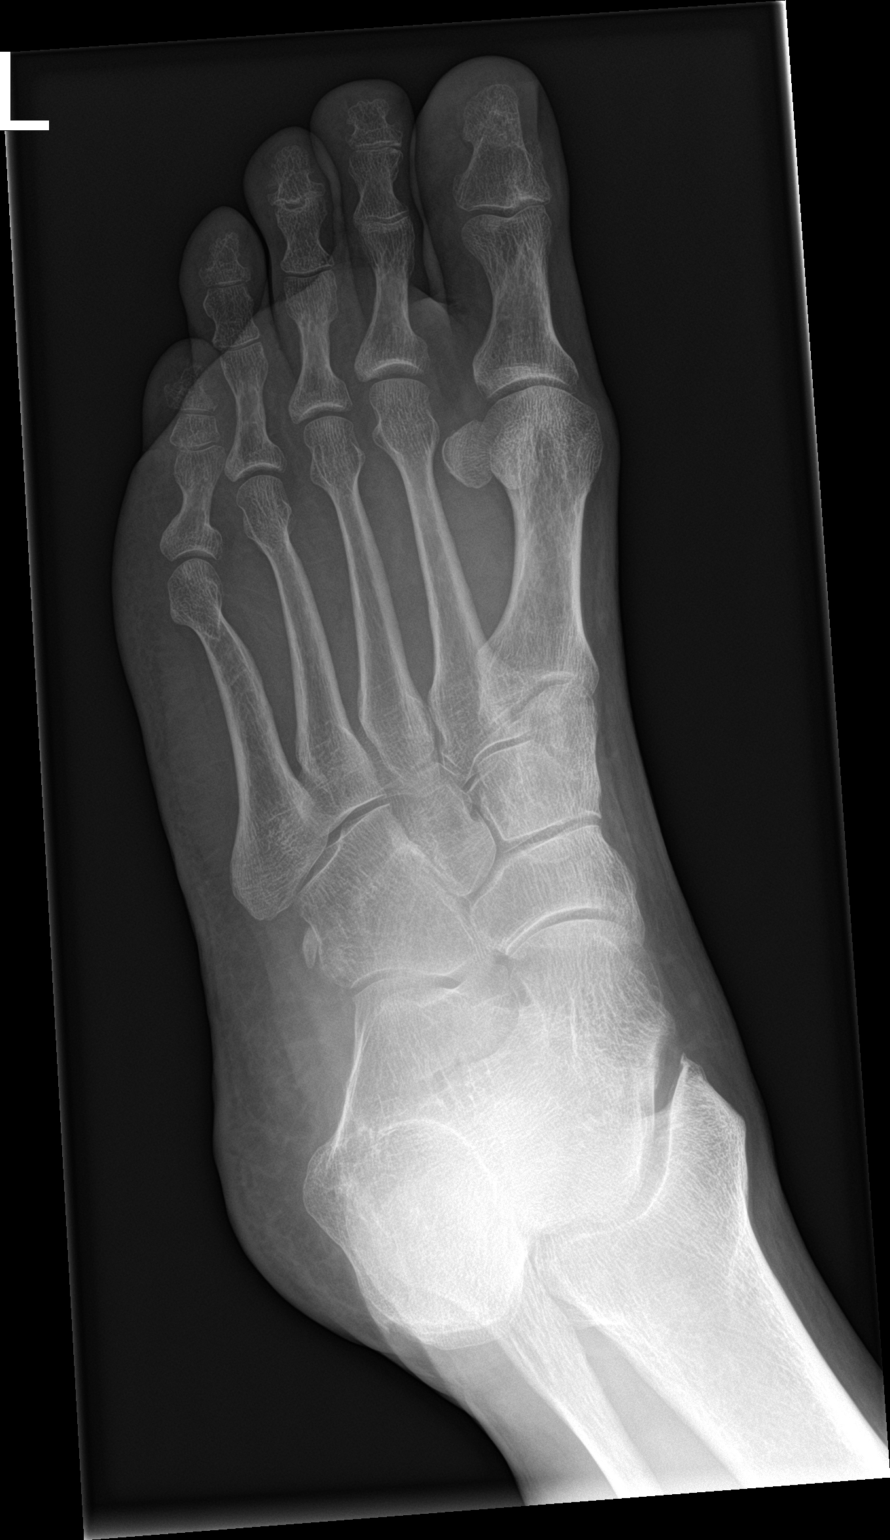

[foot lat]
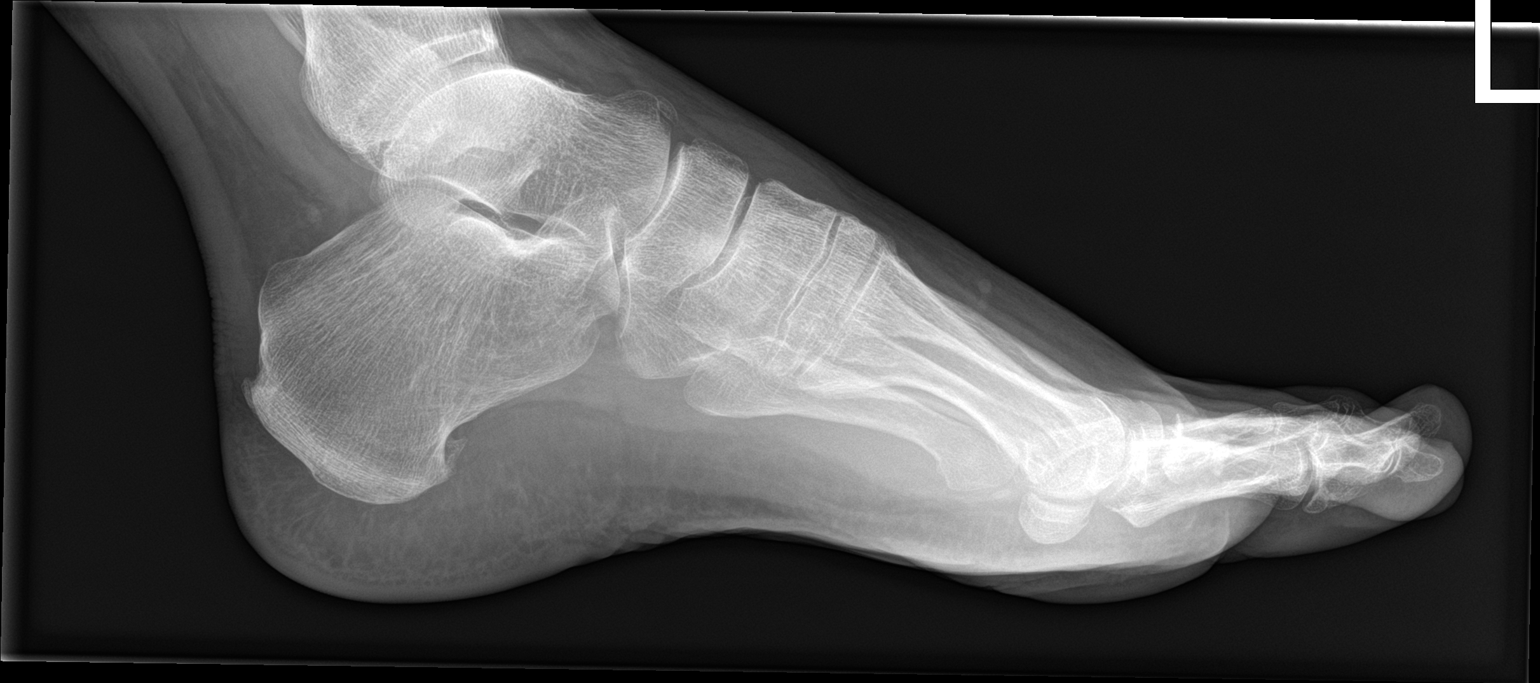

[3 of 3 positions shown; findings below may reference images not displayed]

FINDINGS: No fracture or dislocation. No suspicious focal osseous lesions.
Moderate osteoarthritis in the distal interphalangeal joint of the
left third toe with the suggestion of a well corticated central
erosion. Otherwise no significant arthropathy. Tiny Achilles and
small plantar left calcaneal spurs. Mild diffuse soft tissue
swelling. No radiopaque foreign bodies.
IMPRESSION: 1. Mild diffuse left foot soft tissue swelling. No fracture or
malalignment.
2. Moderate DIP joint osteoarthritis in the left third toe, possibly
representing erosive osteoarthritis given the apparent well
corticated central erosion.

## 2017-11-23 LAB — CBC WITH DIFFERENTIAL/PLATELET
BASOS ABS: 44 {cells}/uL (ref 0–200)
Basophils Relative: 0.5 %
EOS ABS: 150 {cells}/uL (ref 15–500)
Eosinophils Relative: 1.7 %
HEMATOCRIT: 44.3 % (ref 38.5–50.0)
HEMOGLOBIN: 15.7 g/dL (ref 13.2–17.1)
Lymphs Abs: 3802 cells/uL (ref 850–3900)
MCH: 33.1 pg — ABNORMAL HIGH (ref 27.0–33.0)
MCHC: 35.4 g/dL (ref 32.0–36.0)
MCV: 93.5 fL (ref 80.0–100.0)
MONOS PCT: 7.4 %
MPV: 10.5 fL (ref 7.5–12.5)
Neutro Abs: 4154 cells/uL (ref 1500–7800)
Neutrophils Relative %: 47.2 %
Platelets: 313 10*3/uL (ref 140–400)
RBC: 4.74 10*6/uL (ref 4.20–5.80)
RDW: 12.7 % (ref 11.0–15.0)
Total Lymphocyte: 43.2 %
WBC: 8.8 10*3/uL (ref 3.8–10.8)
WBCMIX: 651 {cells}/uL (ref 200–950)

## 2017-11-23 LAB — COMPLETE METABOLIC PANEL WITH GFR
AG RATIO: 1.3 (calc) (ref 1.0–2.5)
ALT: 15 U/L (ref 9–46)
AST: 15 U/L (ref 10–35)
Albumin: 4.4 g/dL (ref 3.6–5.1)
Alkaline phosphatase (APISO): 71 U/L (ref 40–115)
BUN / CREAT RATIO: 11 (calc) (ref 6–22)
BUN: 15 mg/dL (ref 7–25)
CALCIUM: 9.6 mg/dL (ref 8.6–10.3)
CO2: 29 mmol/L (ref 20–32)
CREATININE: 1.39 mg/dL — AB (ref 0.70–1.33)
Chloride: 99 mmol/L (ref 98–110)
GFR, EST NON AFRICAN AMERICAN: 56 mL/min/{1.73_m2} — AB (ref >=60)
GFR, Est African American: 65 mL/min/{1.73_m2} (ref >=60)
GLOBULIN: 3.5 g/dL (ref 1.9–3.7)
Glucose, Bld: 104 mg/dL — ABNORMAL HIGH (ref 65–99)
POTASSIUM: 4.4 mmol/L (ref 3.5–5.3)
SODIUM: 137 mmol/L (ref 135–146)
Total Bilirubin: 0.8 mg/dL (ref 0.2–1.2)
Total Protein: 7.9 g/dL (ref 6.1–8.1)

## 2017-11-23 LAB — RPR: RPR: NONREACTIVE

## 2018-01-05 ENCOUNTER — Other Ambulatory Visit: Payer: Self-pay | Admitting: Infectious Disease

## 2018-01-05 DIAGNOSIS — B20 Human immunodeficiency virus [HIV] disease: Secondary | ICD-10-CM

## 2018-02-14 ENCOUNTER — Other Ambulatory Visit: Payer: Self-pay

## 2018-02-14 DIAGNOSIS — Z23 Encounter for immunization: Secondary | ICD-10-CM

## 2018-02-14 DIAGNOSIS — F332 Major depressive disorder, recurrent severe without psychotic features: Secondary | ICD-10-CM

## 2018-02-14 DIAGNOSIS — B20 Human immunodeficiency virus [HIV] disease: Secondary | ICD-10-CM

## 2018-02-14 DIAGNOSIS — I10 Essential (primary) hypertension: Secondary | ICD-10-CM

## 2018-02-15 LAB — T-HELPER CELL (CD4) - (RCID CLINIC ONLY)
CD4 % Helper T Cell: 32 % — ABNORMAL LOW (ref 33–55)
CD4 T Cell Abs: 1020 /uL (ref 400–2700)

## 2018-02-15 LAB — URINE CYTOLOGY ANCILLARY ONLY
Chlamydia: NEGATIVE
Neisseria Gonorrhea: NEGATIVE

## 2018-02-19 LAB — LIPID PANEL
CHOL/HDL RATIO: 5.5 (calc) — AB (ref <5.0)
CHOLESTEROL: 176 mg/dL (ref <200)
HDL: 32 mg/dL — AB (ref >40)
LDL Cholesterol (Calc): 108 mg/dL (calc) — ABNORMAL HIGH
NON-HDL CHOLESTEROL (CALC): 144 mg/dL — AB (ref <130)
TRIGLYCERIDES: 240 mg/dL — AB (ref <150)

## 2018-02-19 LAB — CBC WITH DIFFERENTIAL/PLATELET
Absolute Monocytes: 599 cells/uL (ref 200–950)
BASOS ABS: 37 {cells}/uL (ref 0–200)
Basophils Relative: 0.5 %
EOS PCT: 1.8 %
Eosinophils Absolute: 133 cells/uL (ref 15–500)
HCT: 42.5 % (ref 38.5–50.0)
HEMOGLOBIN: 15.1 g/dL (ref 13.2–17.1)
Lymphs Abs: 3138 cells/uL (ref 850–3900)
MCH: 33.4 pg — AB (ref 27.0–33.0)
MCHC: 35.5 g/dL (ref 32.0–36.0)
MCV: 94 fL (ref 80.0–100.0)
MPV: 10.4 fL (ref 7.5–12.5)
Monocytes Relative: 8.1 %
NEUTROS ABS: 3493 {cells}/uL (ref 1500–7800)
Neutrophils Relative %: 47.2 %
Platelets: 321 10*3/uL (ref 140–400)
RBC: 4.52 10*6/uL (ref 4.20–5.80)
RDW: 12.6 % (ref 11.0–15.0)
TOTAL LYMPHOCYTE: 42.4 %
WBC: 7.4 10*3/uL (ref 3.8–10.8)

## 2018-02-19 LAB — COMPLETE METABOLIC PANEL WITH GFR
AG RATIO: 1.4 (calc) (ref 1.0–2.5)
ALT: 12 U/L (ref 9–46)
AST: 13 U/L (ref 10–35)
Albumin: 4.4 g/dL (ref 3.6–5.1)
Alkaline phosphatase (APISO): 68 U/L (ref 40–115)
BILIRUBIN TOTAL: 1.1 mg/dL (ref 0.2–1.2)
BUN/Creatinine Ratio: 13 (calc) (ref 6–22)
BUN: 18 mg/dL (ref 7–25)
CHLORIDE: 103 mmol/L (ref 98–110)
CO2: 32 mmol/L (ref 20–32)
Calcium: 9.5 mg/dL (ref 8.6–10.3)
Creat: 1.41 mg/dL — ABNORMAL HIGH (ref 0.70–1.33)
GFR, EST AFRICAN AMERICAN: 64 mL/min/{1.73_m2} (ref >=60)
GFR, Est Non African American: 55 mL/min/{1.73_m2} — ABNORMAL LOW (ref >=60)
GLOBULIN: 3.2 g/dL (ref 1.9–3.7)
Glucose, Bld: 76 mg/dL (ref 65–99)
POTASSIUM: 4 mmol/L (ref 3.5–5.3)
SODIUM: 141 mmol/L (ref 135–146)
TOTAL PROTEIN: 7.6 g/dL (ref 6.1–8.1)

## 2018-02-19 LAB — HIV-1 RNA QUANT-NO REFLEX-BLD
HIV 1 RNA Quant: <20 copies/mL
HIV-1 RNA Quant, Log: <1.3 Log copies/mL

## 2018-02-19 LAB — RPR: RPR: NONREACTIVE

## 2018-03-01 ENCOUNTER — Encounter: Payer: Self-pay | Admitting: Infectious Disease

## 2018-03-01 ENCOUNTER — Ambulatory Visit (INDEPENDENT_AMBULATORY_CARE_PROVIDER_SITE_OTHER): Payer: Self-pay | Admitting: Infectious Disease

## 2018-03-01 ENCOUNTER — Ambulatory Visit: Payer: Self-pay

## 2018-03-01 VITALS — BP 131/80 | HR 73 | Temp 98.3°F | Wt 205.0 lb

## 2018-03-01 DIAGNOSIS — B20 Human immunodeficiency virus [HIV] disease: Secondary | ICD-10-CM

## 2018-03-01 DIAGNOSIS — Z23 Encounter for immunization: Secondary | ICD-10-CM

## 2018-03-01 DIAGNOSIS — I10 Essential (primary) hypertension: Secondary | ICD-10-CM

## 2018-03-01 NOTE — Addendum Note (Signed)
Addended by: Valarie Cones on: 03/01/2018 08:58 AM   Modules accepted: Orders

## 2018-03-01 NOTE — Progress Notes (Signed)
Chief complaint: followup for HIV, depression, CKD,   Subjective:    Patient ID: Jonathan Hendricks, male    DOB: 1960-11-18, 58 y.o.   MRN: [id number]  HPI  58 year old with HIV who currently with nice virological suppression on TRIUMEQ   Jonathan Hendricks continues to maintain perfect virologic suppression on TRIUMEQ.  Is in very good spirits and has no specific complaints.  He did ask about his labs including kidney function.     Past Medical History:  Diagnosis Date  . Constipation   . Depression   . Emotional lability 07/16/2014  . Gynecomastia 07/16/2014  . HIV disease (HCC)   . Hypertension   . IBD (inflammatory bowel disease)   . Insomnia 08/29/2014  . Need for prophylactic vaccination against Streptococcus pneumoniae (pneumococcus) 08/24/2017  . Pain in joint, shoulder region 06/18/2015  . Painful legs and moving toes 08/17/2016  . Rectal pain   . Recurrent major depression-severe (HCC) 07/16/2014  . Sexual problems 12/14/2016    No past surgical history on file.  Family History  Problem Relation Age of Onset  . Heart disease Mother       Social History   Socioeconomic History  . Marital status: Single    Spouse name: Not on file  . Number of children: Not on file  . Years of education: Not on file  . Highest education level: Not on file  Occupational History  . Not on file  Social Needs  . Financial resource strain: Not on file  . Food insecurity:    Worry: Not on file    Inability: Not on file  . Transportation needs:    Medical: Not on file    Non-medical: Not on file  Tobacco Use  . Smoking status: Never Smoker  . Smokeless tobacco: Never Used  Substance and Sexual Activity  . Alcohol use: Yes    Alcohol/week: 0.0 standard drinks    Comment: OCCAS.  . Drug use: No  . Sexual activity: Not Currently    Partners: Male    Comment: patient declined condoms  Lifestyle  . Physical activity:    Days per week: Not on file    Minutes per session: Not on file    . Stress: Not on file  Relationships  . Social connections:    Talks on phone: Not on file    Gets together: Not on file    Attends religious service: Not on file    Active member of club or organization: Not on file    Attends meetings of clubs or organizations: Not on file    Relationship status: Not on file  Other Topics Concern  . Not on file  Social History Narrative  . Not on file    No Known Allergies   Current Outpatient Medications:  .  escitalopram (LEXAPRO) 20 MG tablet, Take 1 tablet (20 mg total) by mouth daily. (Patient not taking: Reported on 04/21/2017), Disp: 30 tablet, Rfl: 11 .  lisinopril-hydrochlorothiazide (PRINZIDE,ZESTORETIC) 20-25 MG tablet, Take 1 tablet by mouth daily., Disp: 90 tablet, Rfl: 3 .  OVER THE COUNTER MEDICATION, otc allergy eye gtts, Disp: , Rfl:  .  sildenafil (VIAGRA) 50 MG tablet, Take 1 tablet (50 mg total) by mouth daily as needed for erectile dysfunction., Disp: 30 tablet, Rfl: 1 .  triamcinolone (KENALOG) 0.025 % cream, APPLY EXTERNALLY TO THE AFFECTED AREA TWICE DAILY (Patient not taking: Reported on 04/21/2017), Disp: 30 g, Rfl: 0 .  TRIUMEQ 600-50-300 MG  tablet, TAKE 1 TABLET BY MOUTH DAILY, Disp: 30 tablet, Rfl: 2 .  valACYclovir (VALTREX) 500 MG tablet, TAKE 1 TABLET(500 MG) BY MOUTH DAILY, Disp: 90 tablet, Rfl: 3       Review of Systems  Constitutional: Negative for activity change, appetite change, chills, diaphoresis, fever and unexpected weight change.  HENT: Negative for congestion, rhinorrhea, sinus pressure, sneezing, sore throat and trouble swallowing.   Eyes: Negative for photophobia and visual disturbance.  Respiratory: Negative for cough, chest tightness, shortness of breath, wheezing and stridor.   Cardiovascular: Negative for chest pain, palpitations and leg swelling.  Gastrointestinal: Negative for abdominal distention, abdominal pain, anal bleeding, blood in stool, constipation, nausea and vomiting.   Genitourinary: Negative for difficulty urinating, dysuria, flank pain and hematuria.  Musculoskeletal: Negative for arthralgias, back pain, gait problem, joint swelling and myalgias.  Skin: Negative for pallor and wound.  Neurological: Negative for dizziness, tremors, weakness and light-headedness.  Hematological: Negative for adenopathy. Does not bruise/bleed easily.  Psychiatric/Behavioral: Negative for behavioral problems, confusion, decreased concentration, hallucinations, self-injury, sleep disturbance and suicidal ideas.       Objective:   Physical Exam  Constitutional: He is oriented to person, place, and time. He appears well-developed and well-nourished. No distress.  HENT:  Head: Normocephalic and atraumatic.  Mouth/Throat: Oropharynx is clear and moist. No oropharyngeal exudate.  Eyes: Conjunctivae and EOM are normal. No scleral icterus.  Neck: Normal range of motion. Neck supple.  Cardiovascular: Normal rate, regular rhythm and normal heart sounds.  Pulmonary/Chest: Effort normal. No respiratory distress. He has no wheezes.  Abdominal: Soft. He exhibits no distension. There is no abdominal tenderness. There is no rebound.  Musculoskeletal:        General: No tenderness or edema.  Neurological: He is alert and oriented to person, place, and time. He exhibits normal muscle tone. Coordination normal.  Skin: Skin is warm and dry. He is not diaphoretic. No pallor.  Psychiatric: He has a normal mood and affect. His behavior is normal. Judgment and thought content normal. His affect is not angry and not inappropriate. His speech is not rapid and/or pressured, not delayed, not tangential and not slurred. He is not agitated, not aggressive and not hyperactive. Thought content is not paranoid and not delusional. Cognition and memory are normal. Cognition and memory are not impaired. He does not exhibit a depressed mood. He expresses no homicidal ideation. He expresses no suicidal plans  and no homicidal plans. He is communicative.  Nursing note and vitals reviewed.         Assessment & Plan:   HIV:  Continue the   TRIUMEQ renew ADAP come back in 6 months for labs and renewal in July  Severe depression with prior suicidal thougts but not currently active at all.  ARF on CKD:off of TNF now. BP better now and I suspect this was more the issue HTN:  well controlled on hydrochlorothiazide and lisinopril  There were no vitals filed for this visit.  Need for prophylactic vaccination he will receive tetanus vaccine he also does need a colonoscopy for healthcare screening but does not have insurance.  I have asked him inquire about the ability to use the [facility] P funds to pay for [employer] care plan.

## 2018-03-21 ENCOUNTER — Encounter: Payer: Self-pay | Admitting: Infectious Disease

## 2018-03-30 ENCOUNTER — Other Ambulatory Visit: Payer: Self-pay | Admitting: Infectious Disease

## 2018-03-30 DIAGNOSIS — B20 Human immunodeficiency virus [HIV] disease: Secondary | ICD-10-CM

## 2018-04-29 ENCOUNTER — Other Ambulatory Visit: Payer: Self-pay | Admitting: Infectious Disease

## 2018-04-29 DIAGNOSIS — I10 Essential (primary) hypertension: Secondary | ICD-10-CM

## 2018-05-10 ENCOUNTER — Telehealth: Payer: Self-pay | Admitting: Behavioral Health

## 2018-05-10 NOTE — Telephone Encounter (Signed)
Patient called requesting to get tested for Mononucleosis.  Patient states he went to the health department today for STI testing however he has been sik for about 3 weeks and they also tested him for TB, did a chest x ray to r/o TB and or pnuemonia. Patient states he would also like to be tested for Covid-19.  Patient has not travelled or been around anyone sick.  Gave him the E visit information so he can do an Evisit to be screened to see if he is suitable for testing. Patient is irrate stating that he should be able to get STD testing anytime he wants at the [facility] clinic.  Explained this is not a walk in STI clinic.  Advised patient that he needs a PCP as gave him information for the internal medicine clinic to call however patient refused stating Dr. Daiva Eves is his PCP.  Informed patient Dr. Daiva Eves would be made aware.  Angeline Slim RN

## 2018-05-10 NOTE — Telephone Encounter (Signed)
We cannot test at a moments notice for STI's. Was he tested for [facility] and chlamydia in OP and rectum or only urine. Normally [facility] is thorough Mono is not a typical diagnosis of his age.   I would recommend E visit or at leaset more symptom clarification

## 2018-05-11 NOTE — Telephone Encounter (Signed)
Patient states he was tested for [facility] thoroughly at the health department with swabs he goes back Friday for follow up.  Suggested he do a E-visit and patient was ok with this advice. Angeline Slim RN

## 2018-05-12 ENCOUNTER — Other Ambulatory Visit: Payer: Self-pay | Admitting: Behavioral Health

## 2018-05-12 MED ORDER — AMOXICILLIN-POT CLAVULANATE 875-125 MG PO TABS: 1.0000 | ORAL_TABLET | Freq: Two times a day (BID) | ORAL | 0 refills | Status: DC

## 2018-05-12 NOTE — Telephone Encounter (Signed)
Darl Pikes TB coordinator called from the health department stating patient came in for STI testing and treatment but while there complained of a productive cough x3 weeks, fatigue, night sweats, and SOB at times.  They placed a TB skin test that is to be read Friday 05/13/2018, and gave him a paper order to go to [facility] to have a chest x-ray done.  Patient went to [facility] to have chest x-ray done however because of his symptoms he was stopped at the door and told he  unable to get x-ray.  Patient states he cannot afford to go to the ER because he has no insurance and no income.  Informed Dr. Ninetta Lights of this situation, Dr. Ninetta Lights gave verbal order for Augmentin 875-125mg  twice daily for one week.  Called Patient and informed him he can go to the ER and get chest x-ray patient refused stating he doesn't want a bill.  He could have gotten it done for free at [facility] through the health department.  Informed patient  Dr. Ninetta Lights sent a prescription for Augmentin with instructions, patient verbalized understanding and was ok with this plan.  He states he is going to follow up with the health department tomorrow for them to read his TB test. Angeline Slim RN

## 2018-09-06 ENCOUNTER — Encounter: Payer: Self-pay | Admitting: Infectious Disease

## 2018-09-06 ENCOUNTER — Other Ambulatory Visit: Payer: Self-pay

## 2018-09-06 ENCOUNTER — Ambulatory Visit: Payer: Self-pay

## 2018-09-06 DIAGNOSIS — B20 Human immunodeficiency virus [HIV] disease: Secondary | ICD-10-CM

## 2018-09-07 LAB — T-HELPER CELL (CD4) - (RCID CLINIC ONLY)
CD4 % Helper T Cell: 32 % — ABNORMAL LOW (ref 33–65)
CD4 T Cell Abs: 1006 /uL (ref 400–1790)

## 2018-09-10 LAB — COMPLETE METABOLIC PANEL WITH GFR
AG Ratio: 1.2 (calc) (ref 1.0–2.5)
ALT: 15 U/L (ref 9–46)
AST: 12 U/L (ref 10–35)
Albumin: 4.2 g/dL (ref 3.6–5.1)
Alkaline phosphatase (APISO): 78 U/L (ref 35–144)
BUN/Creatinine Ratio: 15 (calc) (ref 6–22)
BUN: 21 mg/dL (ref 7–25)
CO2: 29 mmol/L (ref 20–32)
Calcium: 9.4 mg/dL (ref 8.6–10.3)
Chloride: 103 mmol/L (ref 98–110)
Creat: 1.41 mg/dL — ABNORMAL HIGH (ref 0.70–1.33)
GFR, Est African American: 64 mL/min/{1.73_m2} (ref >=60)
GFR, Est Non African American: 55 mL/min/{1.73_m2} — ABNORMAL LOW (ref >=60)
Globulin: 3.4 g/dL (calc) (ref 1.9–3.7)
Glucose, Bld: 114 mg/dL — ABNORMAL HIGH (ref 65–99)
Potassium: 4.2 mmol/L (ref 3.5–5.3)
Sodium: 139 mmol/L (ref 135–146)
Total Bilirubin: 0.6 mg/dL (ref 0.2–1.2)
Total Protein: 7.6 g/dL (ref 6.1–8.1)

## 2018-09-10 LAB — CBC WITH DIFFERENTIAL/PLATELET
Absolute Monocytes: 488 cells/uL (ref 200–950)
Basophils Absolute: 48 cells/uL (ref 0–200)
Basophils Relative: 0.6 %
Eosinophils Absolute: 176 cells/uL (ref 15–500)
Eosinophils Relative: 2.2 %
HCT: 42.8 % (ref 38.5–50.0)
Hemoglobin: 15.1 g/dL (ref 13.2–17.1)
Lymphs Abs: 3448 cells/uL (ref 850–3900)
MCH: 33.5 pg — ABNORMAL HIGH (ref 27.0–33.0)
MCHC: 35.3 g/dL (ref 32.0–36.0)
MCV: 94.9 fL (ref 80.0–100.0)
MPV: 10.5 fL (ref 7.5–12.5)
Monocytes Relative: 6.1 %
Neutro Abs: 3840 cells/uL (ref 1500–7800)
Neutrophils Relative %: 48 %
Platelets: 296 10*3/uL (ref 140–400)
RBC: 4.51 10*6/uL (ref 4.20–5.80)
RDW: 13 % (ref 11.0–15.0)
Total Lymphocyte: 43.1 %
WBC: 8 10*3/uL (ref 3.8–10.8)

## 2018-09-10 LAB — HIV-1 RNA QUANT-NO REFLEX-BLD
HIV 1 RNA Quant: <20 copies/mL
HIV-1 RNA Quant, Log: <1.3 Log copies/mL

## 2018-09-10 LAB — RPR: RPR Ser Ql: NONREACTIVE

## 2018-09-10 LAB — LIPID PANEL
Cholesterol: 173 mg/dL (ref <200)
HDL: 34 mg/dL — ABNORMAL LOW (ref >=40)
LDL Cholesterol (Calc): 109 mg/dL (calc) — ABNORMAL HIGH
Non-HDL Cholesterol (Calc): 139 mg/dL (calc) — ABNORMAL HIGH (ref <130)
Total CHOL/HDL Ratio: 5.1 (calc) — ABNORMAL HIGH (ref <5.0)
Triglycerides: 182 mg/dL — ABNORMAL HIGH (ref <150)

## 2018-09-19 ENCOUNTER — Other Ambulatory Visit: Payer: Self-pay | Admitting: Internal Medicine

## 2018-09-19 ENCOUNTER — Telehealth: Payer: Self-pay | Admitting: Infectious Disease

## 2018-09-19 DIAGNOSIS — B20 Human immunodeficiency virus [HIV] disease: Secondary | ICD-10-CM

## 2018-09-19 NOTE — Telephone Encounter (Signed)
COVID-19 Pre-Screening Questions:09/19/18 ° °Do you currently have a fever (>100 °F), chills or unexplained body aches? NO ° °Are you currently experiencing new cough, shortness of breath, sore throat, runny nose? NO °•  °Have you recently travelled outside the state of [place] in the last 14 days? NO ° °Have you been in contact with someone that is currently pending confirmation of Covid19 testing or has been confirmed to have the Covid19 virus? NO ° °**If the patient answers NO to ALL questions -  advise the patient to please call the clinic before coming to the office should any symptoms develop.  ° ° ° °

## 2018-09-20 ENCOUNTER — Encounter: Payer: Self-pay | Admitting: Infectious Disease

## 2018-09-20 ENCOUNTER — Ambulatory Visit (INDEPENDENT_AMBULATORY_CARE_PROVIDER_SITE_OTHER): Payer: Self-pay | Admitting: Infectious Disease

## 2018-09-20 ENCOUNTER — Other Ambulatory Visit: Payer: Self-pay

## 2018-09-20 VITALS — BP 115/75 | HR 67 | Temp 98.3°F

## 2018-09-20 DIAGNOSIS — A539 Syphilis, unspecified: Secondary | ICD-10-CM

## 2018-09-20 DIAGNOSIS — Z79899 Other long term (current) drug therapy: Secondary | ICD-10-CM

## 2018-09-20 DIAGNOSIS — B20 Human immunodeficiency virus [HIV] disease: Secondary | ICD-10-CM

## 2018-09-20 DIAGNOSIS — I10 Essential (primary) hypertension: Secondary | ICD-10-CM

## 2018-09-20 NOTE — Progress Notes (Signed)
Chief complaint: followup for HIV, depression, CKD,   Subjective:    Patient ID: Jonathan Hendricks, male    DOB: 04-10-1960, 58 y.o.   MRN: [id number]  HPI  58 year old with HIV who currently with nice virological suppression on TRIUMEQ   Doctor continues to maintain perfect virologic suppression on TRIUMEQ.  Jonathan Hendricks maintains excellent virological suppression his blood pressure is also nicely controlled.  He does not smoke.  He is sheltering in place   Past Medical History:  Diagnosis Date  . Constipation   . Depression   . Emotional lability 07/16/2014  . Gynecomastia 07/16/2014  . HIV disease ([place])   . Hypertension   . IBD (inflammatory bowel disease)   . Insomnia 08/29/2014  . Need for prophylactic vaccination against Streptococcus pneumoniae (pneumococcus) 08/24/2017  . Pain in joint, shoulder region 06/18/2015  . Painful legs and moving toes 08/17/2016  . Rectal pain   . Recurrent major depression-severe ([place]) 07/16/2014  . Sexual problems 12/14/2016    No past surgical history on file.  Family History  Problem Relation Age of Onset  . Heart disease Mother       Social History   Socioeconomic History  . Marital status: Single    Spouse name: Not on file  . Number of children: Not on file  . Years of education: Not on file  . Highest education level: Not on file  Occupational History  . Not on file  Social Needs  . Financial resource strain: Not on file  . Food insecurity    Worry: Not on file    Inability: Not on file  . Transportation needs    Medical: Not on file    Non-medical: Not on file  Tobacco Use  . Smoking status: Never Smoker  . Smokeless tobacco: Never Used  Substance and Sexual Activity  . Alcohol use: Yes    Alcohol/week: 0.0 standard drinks    Comment: OCCAS.  . Drug use: No  . Sexual activity: Not Currently    Partners: Male    Comment: patient declined condoms  Lifestyle  . Physical activity    Days per week: Not on file   Minutes per session: Not on file  . Stress: Not on file  Relationships  . Social Herbalist on phone: Not on file    Gets together: Not on file    Attends religious service: Not on file    Active member of club or organization: Not on file    Attends meetings of clubs or organizations: Not on file    Relationship status: Not on file  Other Topics Concern  . Not on file  Social History Narrative  . Not on file    No Known Allergies   Current Outpatient Medications:  .  lisinopril-hydrochlorothiazide (PRINZIDE,ZESTORETIC) 20-25 MG tablet, TAKE 1 TABLET BY MOUTH DAILY, Disp: 90 tablet, Rfl: 3 .  OVER THE COUNTER MEDICATION, otc allergy eye gtts, Disp: , Rfl:  .  sildenafil (VIAGRA) 50 MG tablet, Take 1 tablet (50 mg total) by mouth daily as needed for erectile dysfunction., Disp: 30 tablet, Rfl: 1 .  triamcinolone (KENALOG) 0.025 % cream, APPLY EXTERNALLY TO THE AFFECTED AREA TWICE DAILY, Disp: 30 g, Rfl: 0 .  TRIUMEQ 600-50-300 MG tablet, TAKE 1 TABLET BY MOUTH DAILY, Disp: 30 tablet, Rfl: 5 .  valACYclovir (VALTREX) 500 MG tablet, TAKE 1 TABLET(500 MG) BY MOUTH DAILY, Disp: 90 tablet, Rfl: 3 .  amoxicillin-clavulanate (AUGMENTIN)  875-125 MG tablet, Take 1 tablet by mouth 2 (two) times daily. (Patient not taking: Reported on 09/20/2018), Disp: 14 tablet, Rfl: 0 .  escitalopram (LEXAPRO) 20 MG tablet, Take 1 tablet (20 mg total) by mouth daily. (Patient not taking: Reported on 09/20/2018), Disp: 30 tablet, Rfl: 11       Review of Systems  Constitutional: Negative for activity change, appetite change, chills, diaphoresis, fever and unexpected weight change.  HENT: Negative for congestion, rhinorrhea, sinus pressure, sneezing, sore throat and trouble swallowing.   Eyes: Negative for photophobia and visual disturbance.  Respiratory: Negative for cough, chest tightness, shortness of breath, wheezing and stridor.   Cardiovascular: Negative for chest pain, palpitations and leg  swelling.  Gastrointestinal: Negative for abdominal distention, abdominal pain, anal bleeding, blood in stool, constipation, nausea and vomiting.  Genitourinary: Negative for difficulty urinating, dysuria, flank pain and hematuria.  Musculoskeletal: Negative for arthralgias, back pain, gait problem, joint swelling and myalgias.  Skin: Negative for pallor and wound.  Neurological: Negative for dizziness, tremors, weakness and light-headedness.  Hematological: Negative for adenopathy. Does not bruise/bleed easily.  Psychiatric/Behavioral: Negative for agitation, behavioral problems, confusion, decreased concentration, hallucinations, self-injury, sleep disturbance and suicidal ideas.       Objective:   Physical Exam  Constitutional: He is oriented to person, place, and time. He appears well-developed and well-nourished. No distress.  HENT:  Head: Normocephalic and atraumatic.  Mouth/Throat: Oropharynx is clear and moist. No oropharyngeal exudate.  Eyes: Conjunctivae and EOM are normal. No scleral icterus.  Neck: Normal range of motion. Neck supple.  Cardiovascular: Normal rate, regular rhythm and normal heart sounds.  Pulmonary/Chest: Effort normal. No respiratory distress. He has no wheezes.  Abdominal: Soft. He exhibits no distension. There is no abdominal tenderness. There is no rebound.  Musculoskeletal:        General: No tenderness, deformity or edema.  Neurological: He is alert and oriented to person, place, and time. He exhibits normal muscle tone. Coordination normal.  Skin: Skin is warm and dry. He is not diaphoretic. No pallor.  Psychiatric: He has a normal mood and affect. His behavior is normal. Judgment and thought content normal. His affect is not angry and not inappropriate. His speech is not rapid and/or pressured, not delayed, not tangential and not slurred. He is not agitated, not aggressive and not hyperactive. Thought content is not paranoid and not delusional. Cognition  and memory are normal. Cognition and memory are not impaired. He does not exhibit a depressed mood. He expresses no homicidal ideation. He expresses no suicidal plans and no homicidal plans. He is communicative.  Nursing note and vitals reviewed.         Assessment & Plan:   HIV:  Continue the   TRIUMEQ renew ADAP come back in 6 months for labs and renewal in January  Severe depression with prior suicidal thougts but not currently active at all. Syphilis: NON-REACTIVE (07/14 0909) ARF on CKD:off of TNF now. BP better now and I suspect this was more the issue HTN:  well controlled on hydrochlorothiazide and lisinopril  Vitals:   09/20/18 0922  BP: 115/75  Pulse: 67  Temp: 98.3 F (36.8 C)

## 2018-12-15 ENCOUNTER — Other Ambulatory Visit: Payer: Self-pay | Admitting: Infectious Disease

## 2019-01-17 ENCOUNTER — Other Ambulatory Visit: Payer: Self-pay

## 2019-01-17 ENCOUNTER — Ambulatory Visit: Payer: Self-pay

## 2019-02-08 ENCOUNTER — Other Ambulatory Visit: Payer: Self-pay

## 2019-02-08 DIAGNOSIS — N529 Male erectile dysfunction, unspecified: Secondary | ICD-10-CM

## 2019-02-08 MED ORDER — SILDENAFIL CITRATE 20 MG PO TABS: 20.0000 mg | ORAL_TABLET | ORAL | 1 refills | Status: DC | PRN

## 2019-02-08 MED ORDER — SILDENAFIL CITRATE 50 MG PO TABS: 25.0000 mg | ORAL_TABLET | Freq: Every day | ORAL | 1 refills | Status: DC | PRN

## 2019-02-08 NOTE — Telephone Encounter (Signed)
Fine to refill that

## 2019-02-08 NOTE — Addendum Note (Signed)
Addended by: Eugenia Mcalpine on: 02/08/2019 11:19 AM   Modules accepted: Orders

## 2019-02-08 NOTE — Telephone Encounter (Signed)
Received call from pharmacy requesting Revatio 20mg ; quantity 40 tabs Take as needed for erectile dysfunction  States patient has been getting RX this way due to cost effectiveness.    Siladenfil updated.  Eugenia Mcalpine

## 2019-02-08 NOTE — Addendum Note (Signed)
Addended by: Eugenia Mcalpine on: 02/08/2019 03:04 PM   Modules accepted: Orders

## 2019-02-08 NOTE — Telephone Encounter (Signed)
Patient called requesting refill on sildenafil. Current Rx has expired. Routing to Dr. Margaretmary Bayley

## 2019-03-07 ENCOUNTER — Ambulatory Visit: Payer: Self-pay

## 2019-03-07 ENCOUNTER — Other Ambulatory Visit: Payer: Self-pay

## 2019-03-07 DIAGNOSIS — B20 Human immunodeficiency virus [HIV] disease: Secondary | ICD-10-CM

## 2019-03-07 DIAGNOSIS — I10 Essential (primary) hypertension: Secondary | ICD-10-CM

## 2019-03-07 DIAGNOSIS — Z23 Encounter for immunization: Secondary | ICD-10-CM

## 2019-03-07 DIAGNOSIS — Z79899 Other long term (current) drug therapy: Secondary | ICD-10-CM

## 2019-03-07 DIAGNOSIS — A539 Syphilis, unspecified: Secondary | ICD-10-CM

## 2019-03-07 LAB — URINALYSIS, ROUTINE W REFLEX MICROSCOPIC
Bacteria, UA: NONE SEEN /HPF
Bilirubin Urine: NEGATIVE
Glucose, UA: NEGATIVE
Hgb urine dipstick: NEGATIVE
Hyaline Cast: NONE SEEN /LPF
Ketones, ur: NEGATIVE
Leukocytes,Ua: NEGATIVE
Nitrite: NEGATIVE
RBC / HPF: NONE SEEN /HPF (ref 0–2)
Specific Gravity, Urine: 1.024 (ref 1.001–1.03)
WBC, UA: NONE SEEN /HPF (ref 0–5)
pH: 5.5 (ref 5.0–8.0)

## 2019-03-07 NOTE — Addendum Note (Signed)
Addended by: Clista Bernhardt on: 03/07/2019 09:23 AM   Modules accepted: Orders

## 2019-03-07 NOTE — Progress Notes (Signed)
Verbal order received by Dr. Daiva Eves due to patient having dark urine/flank back pain.

## 2019-03-07 NOTE — Addendum Note (Signed)
Addended by: Clista Bernhardt on: 03/07/2019 09:37 AM   Modules accepted: Orders

## 2019-03-08 LAB — URINE CYTOLOGY ANCILLARY ONLY
Chlamydia: NEGATIVE
Comment: NEGATIVE
Comment: NORMAL
Neisseria Gonorrhea: NEGATIVE

## 2019-03-08 LAB — T-HELPER CELL (CD4) - (RCID CLINIC ONLY)
CD4 % Helper T Cell: 38 % (ref 33–65)
CD4 T Cell Abs: 986 /uL (ref 400–1790)

## 2019-03-11 LAB — CBC WITH DIFFERENTIAL/PLATELET
Absolute Monocytes: 371 cells/uL (ref 200–950)
Basophils Absolute: 32 cells/uL (ref 0–200)
Basophils Relative: 0.5 %
Eosinophils Absolute: 122 cells/uL (ref 15–500)
Eosinophils Relative: 1.9 %
HCT: 41.2 % (ref 38.5–50.0)
Hemoglobin: 14.3 g/dL (ref 13.2–17.1)
Lymphs Abs: 2643 cells/uL (ref 850–3900)
MCH: 33.1 pg — ABNORMAL HIGH (ref 27.0–33.0)
MCHC: 34.7 g/dL (ref 32.0–36.0)
MCV: 95.4 fL (ref 80.0–100.0)
MPV: 10.4 fL (ref 7.5–12.5)
Monocytes Relative: 5.8 %
Neutro Abs: 3232 cells/uL (ref 1500–7800)
Neutrophils Relative %: 50.5 %
Platelets: 303 10*3/uL (ref 140–400)
RBC: 4.32 10*6/uL (ref 4.20–5.80)
RDW: 12.4 % (ref 11.0–15.0)
Total Lymphocyte: 41.3 %
WBC: 6.4 10*3/uL (ref 3.8–10.8)

## 2019-03-11 LAB — COMPLETE METABOLIC PANEL WITH GFR
AG Ratio: 1.4 (calc) (ref 1.0–2.5)
ALT: 8 U/L — ABNORMAL LOW (ref 9–46)
AST: 11 U/L (ref 10–35)
Albumin: 4.4 g/dL (ref 3.6–5.1)
Alkaline phosphatase (APISO): 109 U/L (ref 35–144)
BUN/Creatinine Ratio: 10 (calc) (ref 6–22)
BUN: 14 mg/dL (ref 7–25)
CO2: 32 mmol/L (ref 20–32)
Calcium: 9.2 mg/dL (ref 8.6–10.3)
Chloride: 103 mmol/L (ref 98–110)
Creat: 1.41 mg/dL — ABNORMAL HIGH (ref 0.70–1.33)
GFR, Est African American: 63 mL/min/{1.73_m2} (ref >=60)
GFR, Est Non African American: 55 mL/min/{1.73_m2} — ABNORMAL LOW (ref >=60)
Globulin: 3.2 g/dL (calc) (ref 1.9–3.7)
Glucose, Bld: 125 mg/dL — ABNORMAL HIGH (ref 65–99)
Potassium: 4.2 mmol/L (ref 3.5–5.3)
Sodium: 140 mmol/L (ref 135–146)
Total Bilirubin: 0.6 mg/dL (ref 0.2–1.2)
Total Protein: 7.6 g/dL (ref 6.1–8.1)

## 2019-03-11 LAB — LIPID PANEL
Cholesterol: 165 mg/dL (ref <200)
HDL: 34 mg/dL — ABNORMAL LOW (ref >=40)
LDL Cholesterol (Calc): 101 mg/dL (calc) — ABNORMAL HIGH
Non-HDL Cholesterol (Calc): 131 mg/dL (calc) — ABNORMAL HIGH (ref <130)
Total CHOL/HDL Ratio: 4.9 (calc) (ref <5.0)
Triglycerides: 188 mg/dL — ABNORMAL HIGH (ref <150)

## 2019-03-11 LAB — HIV-1 RNA QUANT-NO REFLEX-BLD
HIV 1 RNA Quant: <20 copies/mL
HIV-1 RNA Quant, Log: <1.3 Log copies/mL

## 2019-03-11 LAB — RPR: RPR Ser Ql: NONREACTIVE

## 2019-03-20 ENCOUNTER — Other Ambulatory Visit: Payer: Self-pay | Admitting: Infectious Disease

## 2019-03-20 DIAGNOSIS — B20 Human immunodeficiency virus [HIV] disease: Secondary | ICD-10-CM

## 2019-03-22 ENCOUNTER — Ambulatory Visit (INDEPENDENT_AMBULATORY_CARE_PROVIDER_SITE_OTHER): Payer: Self-pay | Admitting: Infectious Disease

## 2019-03-22 ENCOUNTER — Encounter: Payer: Self-pay | Admitting: Infectious Disease

## 2019-03-22 ENCOUNTER — Other Ambulatory Visit: Payer: Self-pay

## 2019-03-22 VITALS — BP 107/74 | HR 73 | Temp 98.1°F | Ht 75.0 in | Wt 191.0 lb

## 2019-03-22 DIAGNOSIS — Z87442 Personal history of urinary calculi: Secondary | ICD-10-CM

## 2019-03-22 DIAGNOSIS — B20 Human immunodeficiency virus [HIV] disease: Secondary | ICD-10-CM

## 2019-03-22 DIAGNOSIS — Z79899 Other long term (current) drug therapy: Secondary | ICD-10-CM

## 2019-03-22 DIAGNOSIS — N1832 Chronic kidney disease, stage 3b: Secondary | ICD-10-CM

## 2019-03-22 DIAGNOSIS — I10 Essential (primary) hypertension: Secondary | ICD-10-CM

## 2019-03-22 DIAGNOSIS — R300 Dysuria: Secondary | ICD-10-CM

## 2019-03-22 NOTE — Progress Notes (Signed)
Chief complaint: Dysuria with discolored urine and flank pain   Subjective:    Patient ID: Jonathan Hendricks, male    DOB: 12-02-1960, 59 y.o.   MRN: [id number]  HPI  59 year old with HIV who currently with nice virological suppression on TRIUMEQ   Jonathan Hendricks continues to maintain perfect virologic suppression on TRIUMEQ.  Jonathan Hendricks had felt quite poorly earlier in the month when he was trying to come in for labs and was suffering from some left-sided groin and flank pain as well as dysuria and hematuria.  The dysuria was so bad that he also stopped having any ejaculations.  He asked if he could have his urine looked at and we did perform a urinalysis here which was completely normal.  Of note the patient's symptoms had however improved in the interim.  He does not seem to have much symptoms at present but is worried about this potentially recurring.     Past Medical History:  Diagnosis Date  . Constipation   . Depression   . Emotional lability 07/16/2014  . Gynecomastia 07/16/2014  . HIV disease (HCC)   . Hypertension   . IBD (inflammatory bowel disease)   . Insomnia 08/29/2014  . Need for prophylactic vaccination against Streptococcus pneumoniae (pneumococcus) 08/24/2017  . Pain in joint, shoulder region 06/18/2015  . Painful legs and moving toes 08/17/2016  . Rectal pain   . Recurrent major depression-severe (HCC) 07/16/2014  . Sexual problems 12/14/2016    No past surgical history on file.  Family History  Problem Relation Age of Onset  . Heart disease Mother       Social History   Socioeconomic History  . Marital status: Single    Spouse name: Not on file  . Number of children: Not on file  . Years of education: Not on file  . Highest education level: Not on file  Occupational History  . Not on file  Tobacco Use  . Smoking status: Never Smoker  . Smokeless tobacco: Never Used  Substance and Sexual Activity  . Alcohol use: Yes    Alcohol/week: 0.0 standard drinks   Comment: OCCAS.  . Drug use: No  . Sexual activity: Not Currently    Partners: Male    Comment: patient declined condoms  Other Topics Concern  . Not on file  Social History Narrative  . Not on file   Social Determinants of Health   Financial Resource Strain:   . Difficulty of Paying Living Expenses: Not on file  Food Insecurity:   . Worried About Programme researcher, broadcasting/film/video in the Last Year: Not on file  . Ran Out of Food in the Last Year: Not on file  Transportation Needs:   . Lack of Transportation (Medical): Not on file  . Lack of Transportation (Non-Medical): Not on file  Physical Activity:   . Days of Exercise per Week: Not on file  . Minutes of Exercise per Session: Not on file  Stress:   . Feeling of Stress : Not on file  Social Connections:   . Frequency of Communication with Friends and Family: Not on file  . Frequency of Social Gatherings with Friends and Family: Not on file  . Attends Religious Services: Not on file  . Active Member of Clubs or Organizations: Not on file  . Attends Banker Meetings: Not on file  . Marital Status: Not on file    No Known Allergies   Current Outpatient Medications:  .  lisinopril-hydrochlorothiazide (PRINZIDE,ZESTORETIC) 20-25 MG tablet, TAKE 1 TABLET BY MOUTH DAILY, Disp: 90 tablet, Rfl: 3 .  sildenafil (REVATIO) 20 MG tablet, Take 1 tablet (20 mg total) by mouth as needed., Disp: 40 tablet, Rfl: 1 .  TRIUMEQ 600-50-300 MG tablet, TAKE 1 TABLET BY MOUTH DAILY, Disp: 30 tablet, Rfl: 5 .  valACYclovir (VALTREX) 500 MG tablet, TAKE 1 TABLET(500 MG) BY MOUTH DAILY, Disp: 90 tablet, Rfl: 3 .  amoxicillin-clavulanate (AUGMENTIN) 875-125 MG tablet, Take 1 tablet by mouth 2 (two) times daily. (Patient not taking: Reported on 09/20/2018), Disp: 14 tablet, Rfl: 0 .  escitalopram (LEXAPRO) 20 MG tablet, Take 1 tablet (20 mg total) by mouth daily. (Patient not taking: Reported on 09/20/2018), Disp: 30 tablet, Rfl: 11 .  OVER THE COUNTER  MEDICATION, otc allergy eye gtts, Disp: , Rfl:  .  triamcinolone (KENALOG) 0.025 % cream, APPLY EXTERNALLY TO THE AFFECTED AREA TWICE DAILY (Patient not taking: Reported on 03/22/2019), Disp: 30 g, Rfl: 0       Review of Systems  Constitutional: Negative for activity change, appetite change, chills, diaphoresis, fever and unexpected weight change.  HENT: Negative for congestion, rhinorrhea, sinus pressure, sneezing, sore throat and trouble swallowing.   Eyes: Negative for photophobia and visual disturbance.  Respiratory: Negative for cough, chest tightness, shortness of breath, wheezing and stridor.   Cardiovascular: Negative for chest pain, palpitations and leg swelling.  Gastrointestinal: Negative for abdominal distention, abdominal pain, anal bleeding, blood in stool, constipation, nausea and vomiting.  Genitourinary: Negative for difficulty urinating, dysuria, flank pain and hematuria.  Musculoskeletal: Negative for arthralgias, back pain, gait problem, joint swelling and myalgias.  Skin: Negative for pallor and wound.  Neurological: Negative for dizziness, tremors, weakness and light-headedness.  Hematological: Negative for adenopathy. Does not bruise/bleed easily.  Psychiatric/Behavioral: Negative for agitation, behavioral problems, confusion, decreased concentration, hallucinations, self-injury, sleep disturbance and suicidal ideas.       Objective:   Physical Exam  Constitutional: He is oriented to person, place, and time. He appears well-developed and well-nourished. No distress.  HENT:  Head: Normocephalic and atraumatic.  Mouth/Throat: Oropharynx is clear and moist. No oropharyngeal exudate.  Eyes: Conjunctivae and EOM are normal. No scleral icterus.  Cardiovascular: Normal rate, regular rhythm and normal heart sounds.  Pulmonary/Chest: Effort normal. No respiratory distress. He has no wheezes.  Abdominal: Soft. He exhibits no distension. There is no abdominal tenderness.  There is no rebound.  Musculoskeletal:        General: No tenderness, deformity or edema.     Cervical back: Normal range of motion and neck supple.  Neurological: He is alert and oriented to person, place, and time. He exhibits normal muscle tone. Coordination normal.  Skin: Skin is warm and dry. He is not diaphoretic. No pallor.  Psychiatric: He has a normal mood and affect. His behavior is normal. Judgment and thought content normal. His affect is not angry and not inappropriate. His speech is not rapid and/or pressured, not delayed, not tangential and not slurred. He is not agitated, not aggressive and not hyperactive. Thought content is not paranoid and not delusional. Cognition and memory are normal. Cognition and memory are not impaired. He does not exhibit a depressed mood. He expresses no homicidal ideation. He expresses no suicidal plans and no homicidal plans. He is communicative.  Nursing note and vitals reviewed.         Assessment & Plan:   Dysuria: We will recheck a urine analysis and check a culture in case  she does have recurrence but his UA was normal and we checked for may have been a kidney stone as well.  HIV:  Continue the   TRIUMEQ renewed hMAP and try to enroll in [facility] using this rtc in July  Severe depression with prior suicidal thougts but not currently active at all.  Syphilis: NON-REACTIVE (01/12 0910)   ARF on CKD:off of TNF now. BP better now and I suspect this was more the issue  HTN:  well controlled on hydrochlorothiazide and lisinopril  Vitals:   03/22/19 0928  BP: 107/74  Pulse: 73  Temp: 98.1 F (36.7 C)  SpO2: 97%

## 2019-03-23 LAB — URINALYSIS, ROUTINE W REFLEX MICROSCOPIC
Bilirubin Urine: NEGATIVE
Glucose, UA: NEGATIVE
Hgb urine dipstick: NEGATIVE
Ketones, ur: NEGATIVE
Leukocytes,Ua: NEGATIVE
Nitrite: NEGATIVE
Protein, ur: NEGATIVE
Specific Gravity, Urine: 1.021 (ref 1.001–1.03)
pH: 5.5 (ref 5.0–8.0)

## 2019-03-23 LAB — URINE CULTURE
MICRO NUMBER:: 10086631
SPECIMEN QUALITY:: ADEQUATE

## 2019-04-18 ENCOUNTER — Other Ambulatory Visit: Payer: Self-pay | Admitting: Infectious Disease

## 2019-04-18 DIAGNOSIS — I10 Essential (primary) hypertension: Secondary | ICD-10-CM

## 2019-04-24 ENCOUNTER — Encounter: Payer: Self-pay | Admitting: Infectious Disease

## 2019-08-18 ENCOUNTER — Other Ambulatory Visit: Payer: Medicaid Other

## 2019-09-06 ENCOUNTER — Encounter: Payer: Medicaid Other | Admitting: Infectious Disease

## 2019-09-08 ENCOUNTER — Ambulatory Visit: Payer: Medicaid Other

## 2019-09-12 ENCOUNTER — Ambulatory Visit: Payer: Medicaid Other

## 2019-09-14 ENCOUNTER — Other Ambulatory Visit: Payer: Self-pay

## 2019-09-14 ENCOUNTER — Ambulatory Visit: Payer: Self-pay

## 2019-09-25 ENCOUNTER — Other Ambulatory Visit: Payer: Self-pay

## 2019-09-25 DIAGNOSIS — I10 Essential (primary) hypertension: Secondary | ICD-10-CM

## 2019-09-25 DIAGNOSIS — R300 Dysuria: Secondary | ICD-10-CM

## 2019-09-25 DIAGNOSIS — B20 Human immunodeficiency virus [HIV] disease: Secondary | ICD-10-CM

## 2019-09-25 DIAGNOSIS — Z79899 Other long term (current) drug therapy: Secondary | ICD-10-CM

## 2019-09-25 DIAGNOSIS — N1832 Chronic kidney disease, stage 3b: Secondary | ICD-10-CM

## 2019-09-25 DIAGNOSIS — Z87442 Personal history of urinary calculi: Secondary | ICD-10-CM

## 2019-09-26 LAB — T-HELPER CELL (CD4) - (RCID CLINIC ONLY)
CD4 % Helper T Cell: 30 % — ABNORMAL LOW (ref 33–65)
CD4 T Cell Abs: 845 /uL (ref 400–1790)

## 2019-09-29 ENCOUNTER — Other Ambulatory Visit: Payer: Self-pay | Admitting: Infectious Disease

## 2019-09-29 DIAGNOSIS — B20 Human immunodeficiency virus [HIV] disease: Secondary | ICD-10-CM

## 2019-09-29 LAB — COMPLETE METABOLIC PANEL WITH GFR
AG Ratio: 1.3 (calc) (ref 1.0–2.5)
ALT: 7 U/L — ABNORMAL LOW (ref 9–46)
AST: 12 U/L (ref 10–35)
Albumin: 4.3 g/dL (ref 3.6–5.1)
Alkaline phosphatase (APISO): 103 U/L (ref 35–144)
BUN/Creatinine Ratio: 12 (calc) (ref 6–22)
BUN: 17 mg/dL (ref 7–25)
CO2: 29 mmol/L (ref 20–32)
Calcium: 9.2 mg/dL (ref 8.6–10.3)
Chloride: 101 mmol/L (ref 98–110)
Creat: 1.47 mg/dL — ABNORMAL HIGH (ref 0.70–1.33)
GFR, Est African American: 60 mL/min/{1.73_m2} (ref >=60)
GFR, Est Non African American: 52 mL/min/{1.73_m2} — ABNORMAL LOW (ref >=60)
Globulin: 3.2 g/dL (calc) (ref 1.9–3.7)
Glucose, Bld: 103 mg/dL — ABNORMAL HIGH (ref 65–99)
Potassium: 4 mmol/L (ref 3.5–5.3)
Sodium: 137 mmol/L (ref 135–146)
Total Bilirubin: 0.9 mg/dL (ref 0.2–1.2)
Total Protein: 7.5 g/dL (ref 6.1–8.1)

## 2019-09-29 LAB — CBC WITH DIFFERENTIAL/PLATELET
Absolute Monocytes: 533 cells/uL (ref 200–950)
Basophils Absolute: 43 cells/uL (ref 0–200)
Basophils Relative: 0.6 %
Eosinophils Absolute: 122 cells/uL (ref 15–500)
Eosinophils Relative: 1.7 %
HCT: 41.8 % (ref 38.5–50.0)
Hemoglobin: 14.8 g/dL (ref 13.2–17.1)
Lymphs Abs: 2736 cells/uL (ref 850–3900)
MCH: 34 pg — ABNORMAL HIGH (ref 27.0–33.0)
MCHC: 35.4 g/dL (ref 32.0–36.0)
MCV: 96.1 fL (ref 80.0–100.0)
MPV: 10.4 fL (ref 7.5–12.5)
Monocytes Relative: 7.4 %
Neutro Abs: 3766 cells/uL (ref 1500–7800)
Neutrophils Relative %: 52.3 %
Platelets: 295 10*3/uL (ref 140–400)
RBC: 4.35 10*6/uL (ref 4.20–5.80)
RDW: 13.2 % (ref 11.0–15.0)
Total Lymphocyte: 38 %
WBC: 7.2 10*3/uL (ref 3.8–10.8)

## 2019-09-29 LAB — HIV-1 RNA QUANT-NO REFLEX-BLD
HIV 1 RNA Quant: <20 Copies/mL
HIV-1 RNA Quant, Log: <1.3 Log cps/mL

## 2019-09-29 LAB — LIPID PANEL
Cholesterol: 144 mg/dL (ref <200)
HDL: 37 mg/dL — ABNORMAL LOW (ref >=40)
LDL Cholesterol (Calc): 87 mg/dL (calc)
Non-HDL Cholesterol (Calc): 107 mg/dL (calc) (ref <130)
Total CHOL/HDL Ratio: 3.9 (calc) (ref <5.0)
Triglycerides: 108 mg/dL (ref <150)

## 2019-09-29 LAB — RPR: RPR Ser Ql: NONREACTIVE

## 2019-10-10 ENCOUNTER — Encounter: Payer: Medicaid Other | Admitting: Infectious Disease

## 2019-10-24 ENCOUNTER — Encounter: Payer: Self-pay | Admitting: Infectious Disease

## 2019-10-30 ENCOUNTER — Other Ambulatory Visit: Payer: Self-pay | Admitting: Infectious Disease

## 2019-10-30 DIAGNOSIS — B20 Human immunodeficiency virus [HIV] disease: Secondary | ICD-10-CM

## 2019-10-30 DIAGNOSIS — I10 Essential (primary) hypertension: Secondary | ICD-10-CM

## 2019-12-27 ENCOUNTER — Telehealth: Payer: Self-pay

## 2019-12-27 NOTE — Telephone Encounter (Signed)
Patient calling to request prescription for sildenafil be updated quantity dispense of 100 instead of 40 due to promotional program that his pharmacy is hosting. Patient states he can get 100 pill for $120 vs 40 pills for $105 at [facility]. Routing message to provider for approval to update Rx. Valarie Cones

## 2019-12-27 NOTE — Telephone Encounter (Signed)
I suppose that is OK IF he will rx as directed and with caution

## 2019-12-28 NOTE — Telephone Encounter (Signed)
Attempted to contact patient and make him aware of Dr. Zenaida Niece Dam's response. Will continue to follow and send rx once we speak with patient.  Valarie Cones

## 2019-12-29 ENCOUNTER — Other Ambulatory Visit: Payer: Self-pay

## 2019-12-29 MED ORDER — SILDENAFIL CITRATE 20 MG PO TABS: 20.0000 mg | ORAL_TABLET | ORAL | 0 refills | Status: DC | PRN

## 2019-12-29 NOTE — Telephone Encounter (Signed)
Called and spoke with patient regarding medication. Prescription sent to preferred pharmacy. Patient will call office if needed with any concerns. Jonathan Hendricks, [place]

## 2020-01-24 NOTE — Addendum Note (Signed)
Addended by: Harley Alto on: 01/24/2020 03:47 PM   Modules accepted: Orders

## 2020-01-25 ENCOUNTER — Other Ambulatory Visit: Payer: Self-pay

## 2020-01-25 DIAGNOSIS — B20 Human immunodeficiency virus [HIV] disease: Secondary | ICD-10-CM

## 2020-01-31 ENCOUNTER — Other Ambulatory Visit: Payer: Medicaid Other

## 2020-01-31 DIAGNOSIS — B20 Human immunodeficiency virus [HIV] disease: Secondary | ICD-10-CM

## 2020-02-01 LAB — T-HELPER CELL (CD4) - (RCID CLINIC ONLY)
CD4 % Helper T Cell: 33 % (ref 33–65)
CD4 T Cell Abs: 603 /uL (ref 400–1790)

## 2020-02-04 LAB — COMPLETE METABOLIC PANEL WITH GFR
AG Ratio: 1.3 (calc) (ref 1.0–2.5)
ALT: 10 U/L (ref 9–46)
AST: 13 U/L (ref 10–35)
Albumin: 4.2 g/dL (ref 3.6–5.1)
Alkaline phosphatase (APISO): 101 U/L (ref 35–144)
BUN/Creatinine Ratio: 10 (calc) (ref 6–22)
BUN: 14 mg/dL (ref 7–25)
CO2: 28 mmol/L (ref 20–32)
Calcium: 9.1 mg/dL (ref 8.6–10.3)
Chloride: 99 mmol/L (ref 98–110)
Creat: 1.44 mg/dL — ABNORMAL HIGH (ref 0.70–1.33)
GFR, Est African American: 61 mL/min/{1.73_m2} (ref >=60)
GFR, Est Non African American: 53 mL/min/{1.73_m2} — ABNORMAL LOW (ref >=60)
Globulin: 3.2 g/dL (calc) (ref 1.9–3.7)
Glucose, Bld: 88 mg/dL (ref 65–99)
Potassium: 4 mmol/L (ref 3.5–5.3)
Sodium: 136 mmol/L (ref 135–146)
Total Bilirubin: 0.8 mg/dL (ref 0.2–1.2)
Total Protein: 7.4 g/dL (ref 6.1–8.1)

## 2020-02-04 LAB — CBC WITH DIFFERENTIAL/PLATELET
Absolute Monocytes: 846 cells/uL (ref 200–950)
Basophils Absolute: 48 cells/uL (ref 0–200)
Basophils Relative: 0.5 %
Eosinophils Absolute: 190 cells/uL (ref 15–500)
Eosinophils Relative: 2 %
HCT: 41.6 % (ref 38.5–50.0)
Hemoglobin: 15.1 g/dL (ref 13.2–17.1)
Lymphs Abs: 1881 cells/uL (ref 850–3900)
MCH: 34.4 pg — ABNORMAL HIGH (ref 27.0–33.0)
MCHC: 36.3 g/dL — ABNORMAL HIGH (ref 32.0–36.0)
MCV: 94.8 fL (ref 80.0–100.0)
MPV: 10.2 fL (ref 7.5–12.5)
Monocytes Relative: 8.9 %
Neutro Abs: 6536 cells/uL (ref 1500–7800)
Neutrophils Relative %: 68.8 %
Platelets: 290 10*3/uL (ref 140–400)
RBC: 4.39 10*6/uL (ref 4.20–5.80)
RDW: 11.8 % (ref 11.0–15.0)
Total Lymphocyte: 19.8 %
WBC: 9.5 10*3/uL (ref 3.8–10.8)

## 2020-02-04 LAB — HIV-1 RNA QUANT-NO REFLEX-BLD
HIV 1 RNA Quant: <20 Copies/mL
HIV-1 RNA Quant, Log: <1.3 Log cps/mL

## 2020-02-14 ENCOUNTER — Ambulatory Visit (INDEPENDENT_AMBULATORY_CARE_PROVIDER_SITE_OTHER): Payer: Self-pay | Admitting: Infectious Disease

## 2020-02-14 ENCOUNTER — Other Ambulatory Visit: Payer: Self-pay

## 2020-02-14 ENCOUNTER — Encounter: Payer: Self-pay | Admitting: Infectious Disease

## 2020-02-14 VITALS — BP 123/76 | HR 84 | Temp 98.7°F | Ht 72.0 in | Wt 183.0 lb

## 2020-02-14 DIAGNOSIS — F332 Major depressive disorder, recurrent severe without psychotic features: Secondary | ICD-10-CM

## 2020-02-14 DIAGNOSIS — K6389 Other specified diseases of intestine: Secondary | ICD-10-CM

## 2020-02-14 DIAGNOSIS — K130 Diseases of lips: Secondary | ICD-10-CM

## 2020-02-14 DIAGNOSIS — N1832 Chronic kidney disease, stage 3b: Secondary | ICD-10-CM

## 2020-02-14 DIAGNOSIS — A539 Syphilis, unspecified: Secondary | ICD-10-CM

## 2020-02-14 DIAGNOSIS — K120 Recurrent oral aphthae: Secondary | ICD-10-CM | POA: Insufficient documentation

## 2020-02-14 DIAGNOSIS — B20 Human immunodeficiency virus [HIV] disease: Secondary | ICD-10-CM

## 2020-02-14 HISTORY — DX: Recurrent oral aphthae: K12.0

## 2020-02-14 MED ORDER — TRIAMCINOLONE ACETONIDE 0.1 % EX CREA: 1.0000 "application " | TOPICAL_CREAM | Freq: Two times a day (BID) | CUTANEOUS | 0 refills | Status: DC

## 2020-02-14 NOTE — Progress Notes (Signed)
Chief complaint: lesion on his lower lip  Subjective:    Patient ID: Jonathan Hendricks, male    DOB: [birth year], 59 y.o.   MRN: [id number]  HPI  59 year old with HIV who currently with nice virological suppression on TRIUMEQ    He had noted lesion on his lower lip when he woke up today and wondered if he had bitten himself. It is similar though much larger than "cold sores" he has had frequently in the past esp after his stomach has been 'upset."  He says his depression is relatively well controlled.  He is interested in [place]..     Past Medical History:  Diagnosis Date  . Constipation   . Depression   . Emotional lability 07/16/2014  . Gynecomastia 07/16/2014  . HIV disease (HCC)   . Hypertension   . IBD (inflammatory bowel disease)   . Insomnia 08/29/2014  . Need for prophylactic vaccination against Streptococcus pneumoniae (pneumococcus) 08/24/2017  . Pain in joint, shoulder region 06/18/2015  . Painful legs and moving toes 08/17/2016  . Rectal pain   . Recurrent major depression-severe (HCC) 07/16/2014  . Sexual problems 12/14/2016    No past surgical history on file.  Family History  Problem Relation Age of Onset  . Heart disease Mother       Social History   Socioeconomic History  . Marital status: Single    Spouse name: Not on file  . Number of children: Not on file  . Years of education: Not on file  . Highest education level: Not on file  Occupational History  . Not on file  Tobacco Use  . Smoking status: Never Smoker  . Smokeless tobacco: Never Used  Substance and Sexual Activity  . Alcohol use: Yes    Alcohol/week: 0.0 standard drinks    Comment: OCCAS.  . Drug use: No  . Sexual activity: Not Currently    Partners: Male    Comment: patient declined condoms  Other Topics Concern  . Not on file  Social History Narrative  . Not on file   Social Determinants of Health   Financial Resource Strain: Not on file  Food Insecurity: Not on file   Transportation Needs: Not on file  Physical Activity: Not on file  Stress: Not on file  Social Connections: Not on file    No Known Allergies   Current Outpatient Medications:  .  lisinopril-hydrochlorothiazide (ZESTORETIC) 20-25 MG tablet, TAKE 1 TABLET BY MOUTH DAILY, Disp: 90 tablet, Rfl: 1 .  TRIUMEQ 600-50-300 MG tablet, TAKE 1 TABLET BY MOUTH DAILY, Disp: 30 tablet, Rfl: 0 .  valACYclovir (VALTREX) 500 MG tablet, TAKE 1 TABLET(500 MG) BY MOUTH DAILY, Disp: 90 tablet, Rfl: 3 .  escitalopram (LEXAPRO) 20 MG tablet, Take 1 tablet (20 mg total) by mouth daily. (Patient not taking: No sig reported), Disp: 30 tablet, Rfl: 11 .  OVER THE COUNTER MEDICATION, otc allergy eye gtts (Patient not taking: Reported on 02/14/2020), Disp: , Rfl:  .  sildenafil (REVATIO) 20 MG tablet, Take 1 tablet (20 mg total) by mouth as needed. (Patient not taking: Reported on 02/14/2020), Disp: 100 tablet, Rfl: 0       Review of Systems  Constitutional: Negative for activity change, appetite change, chills, diaphoresis, fever and unexpected weight change.  HENT: Positive for mouth sores. Negative for congestion, rhinorrhea, sinus pressure, sneezing, sore throat and trouble swallowing.   Eyes: Negative for photophobia and visual disturbance.  Respiratory: Negative for cough, chest tightness,  shortness of breath, wheezing and stridor.   Cardiovascular: Negative for chest pain, palpitations and leg swelling.  Gastrointestinal: Negative for abdominal distention, abdominal pain, anal bleeding, blood in stool, constipation, nausea and vomiting.  Genitourinary: Negative for difficulty urinating, dysuria, flank pain and hematuria.  Musculoskeletal: Negative for arthralgias, back pain, gait problem, joint swelling and myalgias.  Skin: Negative for pallor and wound.  Neurological: Negative for dizziness, tremors, weakness and light-headedness.  Hematological: Negative for adenopathy. Does not bruise/bleed easily.   Psychiatric/Behavioral: Negative for agitation, behavioral problems, confusion, decreased concentration, hallucinations, self-injury, sleep disturbance and suicidal ideas.       Objective:   Physical Exam Vitals and nursing note reviewed.  Constitutional:      General: He is not in acute distress.    Appearance: He is well-developed. He is not diaphoretic.  HENT:     Head: Normocephalic and atraumatic.     Mouth/Throat:     Pharynx: No oropharyngeal exudate.  Eyes:     General: No scleral icterus. Cardiovascular:     Rate and Rhythm: Normal rate and regular rhythm.     Heart sounds: Normal heart sounds.  Pulmonary:     Effort: Pulmonary effort is normal. No respiratory distress.     Breath sounds: No wheezing.  Abdominal:     General: There is no distension.     Palpations: Abdomen is soft.     Tenderness: There is no rebound.  Musculoskeletal:        General: No tenderness or deformity.     Cervical back: Normal range of motion and neck supple.  Skin:    General: Skin is warm and dry.     Coloration: Skin is not pale.  Neurological:     Mental Status: He is alert and oriented to person, place, and time.     Motor: No abnormal muscle tone.     Coordination: Coordination normal.  Psychiatric:        Mood and Affect: Mood normal. Mood is not depressed. Affect is not angry or inappropriate.        Speech: He is communicative. Speech is not rapid and pressured, delayed, slurred or tangential.        Behavior: Behavior normal. Behavior is not agitated, aggressive or hyperactive.        Thought Content: Thought content normal. Thought content is not paranoid or delusional. Thought content does not include homicidal ideation. Thought content does not include homicidal or suicidal plan.        Cognition and Memory: Memory is not impaired.        Judgment: Judgment normal.     Lower lip with ulcerative lesion 02/14/2020:          Assessment & Plan:   Large ulcer on  lip: it looks like possible very large aphthous ulcer. I also saw that IBD was listed in his PMHX. I will give him trial of oral steroid   HIV disease: controlled on Triumeq. He would like to go to [place]. If not I would recommend he go to [place] and subtract ABC from his regimen  Severe depression with prior suicidal thougts but not currently active at all. He is doing relatively well  Syphilis: NON-REACTIVE (08/02 0842)   ARF on CKD:off of TNF now. BP better now and I suspect this was more the issue  HTN:  well controlled on hydrochlorothiazide and lisinopril  There were no vitals filed for this visit.

## 2020-02-15 ENCOUNTER — Telehealth: Payer: Self-pay | Admitting: Pharmacist

## 2020-02-15 NOTE — Telephone Encounter (Signed)
Called patient to discuss starting Cabenuva. No answer, left HIPAA compliant VM. 

## 2020-02-19 ENCOUNTER — Other Ambulatory Visit: Payer: Self-pay | Admitting: Infectious Disease

## 2020-02-19 DIAGNOSIS — B20 Human immunodeficiency virus [HIV] disease: Secondary | ICD-10-CM

## 2020-02-19 DIAGNOSIS — I10 Essential (primary) hypertension: Secondary | ICD-10-CM

## 2020-02-20 ENCOUNTER — Telehealth: Payer: Self-pay | Admitting: Pharmacist

## 2020-02-20 NOTE — Telephone Encounter (Signed)
error 

## 2020-02-21 ENCOUNTER — Other Ambulatory Visit: Payer: Self-pay | Admitting: Pharmacist

## 2020-02-21 DIAGNOSIS — B20 Human immunodeficiency virus [HIV] disease: Secondary | ICD-10-CM

## 2020-02-21 MED ORDER — EDURANT 25 MG PO TABS: 25.0000 mg | ORAL_TABLET | Freq: Every day | ORAL | 0 refills | Status: DC

## 2020-02-21 MED ORDER — VOCABRIA 30 MG PO TABS: 1.0000 | ORAL_TABLET | Freq: Every day | ORAL | 0 refills | Status: DC

## 2020-02-21 NOTE — Progress Notes (Signed)
Sending oral lead-in for Cabenuva to [facility] [employer] [facility].

## 2020-02-21 NOTE — Telephone Encounter (Signed)
Patient called back and is interested in starting [place]. He will come and see me next Thursday 1/6 to begin oral lead-in. I will schedule his follow up injections with Dr. Daiva Eves at that visit.

## 2020-02-25 NOTE — Telephone Encounter (Signed)
Thanks Cassie 

## 2020-02-28 ENCOUNTER — Ambulatory Visit: Payer: Medicaid Other

## 2020-02-29 ENCOUNTER — Other Ambulatory Visit: Payer: Self-pay

## 2020-02-29 ENCOUNTER — Telehealth: Payer: Self-pay | Admitting: *Deleted

## 2020-02-29 ENCOUNTER — Ambulatory Visit (INDEPENDENT_AMBULATORY_CARE_PROVIDER_SITE_OTHER): Payer: Self-pay | Admitting: Pharmacist

## 2020-02-29 ENCOUNTER — Ambulatory Visit: Payer: Medicaid Other

## 2020-02-29 DIAGNOSIS — B20 Human immunodeficiency virus [HIV] disease: Secondary | ICD-10-CM

## 2020-02-29 MED ORDER — CABOTEGRAVIR & RILPIVIRINE ER 600 & 900 MG/3ML IM SUER: 1.0000 | Freq: Once | INTRAMUSCULAR | 0 refills | Status: AC
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
## 2020-02-29 NOTE — Telephone Encounter (Signed)
[facility] specialty pharmacy calling to confirm [place] delivery date and location. Call back is [phone]. Andree Coss, RN

## 2020-03-01 ENCOUNTER — Telehealth: Payer: Self-pay | Admitting: Pharmacist

## 2020-03-01 NOTE — Telephone Encounter (Signed)
Renaldo Harrison will be delivered to the clinic on 03/21/20 for his appointment on 03/28/20.

## 2020-03-21 ENCOUNTER — Telehealth: Payer: Self-pay

## 2020-03-21 NOTE — Telephone Encounter (Signed)
[facility] Patient Advocate Encounter  Patient's medication Jonathan Hendricks) have been delivered to [facility] from [employer] and will be administered on 03/28/20 at next appointment.  Clearance Coots , CPhT Specialty Pharmacy Patient [facility] Phone: [phone] Fax:  [phone]

## 2020-03-28 ENCOUNTER — Encounter: Payer: Self-pay | Admitting: Infectious Disease

## 2020-03-28 ENCOUNTER — Ambulatory Visit (INDEPENDENT_AMBULATORY_CARE_PROVIDER_SITE_OTHER): Payer: Self-pay | Admitting: Infectious Disease

## 2020-03-28 ENCOUNTER — Other Ambulatory Visit: Payer: Self-pay

## 2020-03-28 VITALS — BP 119/78 | HR 80 | Temp 98.7°F | Ht 72.0 in | Wt 177.0 lb

## 2020-03-28 DIAGNOSIS — B20 Human immunodeficiency virus [HIV] disease: Secondary | ICD-10-CM

## 2020-03-28 MED ORDER — CABOTEGRAVIR & RILPIVIRINE ER 600 & 900 MG/3ML IM SUER
1.0000 | Freq: Once | INTRAMUSCULAR | Status: AC
  Administered 2020-03-28: 1 via INTRAMUSCULAR

## 2020-03-28 NOTE — Progress Notes (Signed)
Subjective:  Chief complaint: here for first Jonathan Hendricks injection   Patient ID: Jonathan Hendricks, male    DOB: 05-30-1960, 60 y.o.   MRN: [id number]  HPI   60 year old Caucasian man living with HIV that has been well controlled on Triumeq who wished to switch to [place]. He has faithfully taken his oral lead in drugs with food and avoidance of antacids. Only other pills he is taking are his anti-hypertensive and his valtrex.  He had IM injection of loading dose today of Cabenuva by Jonathan Hendricks with 3 ml into each gluteus.  Past Medical History:  Diagnosis Date  . Aphthous ulcer 02/14/2020  . Constipation   . Depression   . Emotional lability 07/16/2014  . Gynecomastia 07/16/2014  . HIV disease ([place])   . Hypertension   . IBD (inflammatory bowel disease)   . Insomnia 08/29/2014  . Need for prophylactic vaccination against Streptococcus pneumoniae (pneumococcus) 08/24/2017  . Pain in joint, shoulder region 06/18/2015  . Painful legs and moving toes 08/17/2016  . Rectal pain   . Recurrent major depression-severe ([place]) 07/16/2014  . Sexual problems 12/14/2016    No past surgical history on file.  Family History  Problem Relation Age of Onset  . Heart disease Mother       Social History   Socioeconomic History  . Marital status: Single    Spouse name: Not on file  . Number of children: Not on file  . Years of education: Not on file  . Highest education level: Not on file  Occupational History  . Not on file  Tobacco Use  . Smoking status: Never Smoker  . Smokeless tobacco: Never Used  Substance and Sexual Activity  . Alcohol use: Yes    Alcohol/week: 0.0 standard drinks    Comment: OCCAS.  . Drug use: No  . Sexual activity: Not Currently    Partners: Male    Comment: patient declined condoms  Other Topics Concern  . Not on file  Social History Narrative  . Not on file   Social Determinants of Health   Financial Resource Strain: Not on file  Food Insecurity: Not  on file  Transportation Needs: Not on file  Physical Activity: Not on file  Stress: Not on file  Social Connections: Not on file    No Known Allergies   Current Outpatient Medications:  .  cabotegravir & rilpivirine ER (CABENUVA) 600 & 900 MG/3ML injection, Inject 1 kit into the muscle once for 1 dose., Disp: 6 mL, Rfl: 0 .  Cabotegravir Sodium (VOCABRIA) 30 MG TABS, Take 1 tablet by mouth daily., Disp: 30 tablet, Rfl: 0 .  escitalopram (LEXAPRO) 20 MG tablet, Take 1 tablet (20 mg total) by mouth daily. (Patient not taking: No sig reported), Disp: 30 tablet, Rfl: 11 .  lisinopril-hydrochlorothiazide (ZESTORETIC) 20-25 MG tablet, TAKE 1 TABLET BY MOUTH DAILY, Disp: 90 tablet, Rfl: 1 .  OVER THE COUNTER MEDICATION, otc allergy eye gtts (Patient not taking: Reported on 02/14/2020), Disp: , Rfl:  .  rilpivirine (EDURANT) 25 MG TABS tablet, Take 1 tablet (25 mg total) by mouth daily with breakfast., Disp: 30 tablet, Rfl: 0 .  sildenafil (REVATIO) 20 MG tablet, Take 1 tablet (20 mg total) by mouth as needed. (Patient not taking: Reported on 02/14/2020), Disp: 100 tablet, Rfl: 0 .  triamcinolone (KENALOG) 0.1 %, Apply 1 application topically 2 (two) times daily., Disp: 30 g, Rfl: 0 .  valACYclovir (VALTREX) 500 MG tablet, TAKE 1  TABLET(500 MG) BY MOUTH DAILY, Disp: 90 tablet, Rfl: 3   Review of Systems  Constitutional: Negative for activity change, appetite change, chills, diaphoresis, fatigue, fever and unexpected weight change.  HENT: Negative for congestion, rhinorrhea, sinus pressure, sneezing, sore throat and trouble swallowing.   Eyes: Negative for photophobia and visual disturbance.  Respiratory: Negative for cough, chest tightness, shortness of breath, wheezing and stridor.   Cardiovascular: Negative for chest pain, palpitations and leg swelling.  Gastrointestinal: Negative for abdominal distention, abdominal pain, anal bleeding, blood in stool, constipation, diarrhea, nausea and  vomiting.  Genitourinary: Negative for difficulty urinating, dysuria, flank pain and hematuria.  Musculoskeletal: Negative for arthralgias, back pain, gait problem, joint swelling and myalgias.  Skin: Negative for color change, pallor, rash and wound.  Neurological: Negative for dizziness, tremors, weakness and light-headedness.  Hematological: Negative for adenopathy. Does not bruise/bleed easily.  Psychiatric/Behavioral: Negative for agitation, behavioral problems, confusion, decreased concentration, dysphoric mood and sleep disturbance.       Objective:   Physical Exam Constitutional:      Appearance: He is well-developed and well-nourished.  HENT:     Head: Normocephalic and atraumatic.  Eyes:     Extraocular Movements: EOM normal.     Conjunctiva/sclera: Conjunctivae normal.  Cardiovascular:     Rate and Rhythm: Normal rate and regular rhythm.  Pulmonary:     Effort: Pulmonary effort is normal. No respiratory distress.     Breath sounds: No wheezing.  Abdominal:     General: There is no distension.     Palpations: Abdomen is soft.  Musculoskeletal:        General: No tenderness or edema. Normal range of motion.     Cervical back: Normal range of motion and neck supple.  Skin:    General: Skin is warm and dry.     Coloration: Skin is not pale.     Findings: No erythema or rash.  Neurological:     General: No focal deficit present.     Mental Status: He is alert and oriented to person, place, and time.  Psychiatric:        Attention and Perception: Attention normal.        Mood and Affect: Mood is anxious.        Behavior: Behavior is cooperative.        Thought Content: Thought content normal.        Cognition and Memory: Memory normal.        Judgment: Judgment normal.           Assessment & Plan:   HIV disease: Jonathan Hendricks tolerated his injections without issue. I counseled him he very likely would feel sore from the injection and that this injection site  tenderness would likely be the only side effect he expierienced though some patients do experience pyrexia with this it is rare.   I also told him in general patients tend to tolerate these injection site reactinos more and more with time.  He can take tylenol and IF necessary low dose ibuprofen with hydration though would prefer for kidney health that he avoid NSAIDS  We will check HIV RNA at his next visit in one months time with Marcos Eke  I told him that FDA has now approved Cabenuva to be given every 2 months to treat HIV.  For now he would prefer to stick with q monthly dosing.  If he were to go to q 4M he would I believe need to have repeat loading  dose #1 a repeat loading dose a month later and then go to maitenance q 2 month dosing but with the higher 35ml dose that he got today and would get for loading  CKD: stable

## 2020-03-28 NOTE — Progress Notes (Signed)
Patient tolerated Cabenuva injections well and was observed for 15 minutes after the injections without incident.   Sandie Ano, RN

## 2020-04-12 ENCOUNTER — Other Ambulatory Visit: Payer: Self-pay | Admitting: Family

## 2020-04-12 MED ORDER — CABOTEGRAVIR & RILPIVIRINE ER 400 & 600 MG/2ML IM SUER: 1.0000 | INTRAMUSCULAR | 5 refills | Status: DC

## 2020-04-15 ENCOUNTER — Encounter: Payer: Self-pay | Admitting: Infectious Diseases

## 2020-04-23 ENCOUNTER — Telehealth: Payer: Self-pay

## 2020-04-23 NOTE — Telephone Encounter (Signed)
[facility] Patient Advocate Encounter  Patient's medication Jonathan Hendricks) have been delivered to [facility] from [facility] and will be administered on the patient next appointment on 04/25/20.  Clearance Coots , CPhT Specialty Pharmacy Patient [facility] for Infectious Disease Phone: [phone] Fax:  [phone]

## 2020-04-25 ENCOUNTER — Encounter: Payer: Self-pay | Admitting: Family

## 2020-04-26 ENCOUNTER — Other Ambulatory Visit: Payer: Self-pay

## 2020-04-26 ENCOUNTER — Ambulatory Visit (INDEPENDENT_AMBULATORY_CARE_PROVIDER_SITE_OTHER): Payer: Self-pay | Admitting: Family

## 2020-04-26 ENCOUNTER — Encounter: Payer: Self-pay | Admitting: Family

## 2020-04-26 VITALS — BP 116/77 | HR 68 | Wt 175.0 lb

## 2020-04-26 DIAGNOSIS — B20 Human immunodeficiency virus [HIV] disease: Secondary | ICD-10-CM

## 2020-04-26 MED ORDER — CABOTEGRAVIR & RILPIVIRINE ER 400 & 600 MG/2ML IM SUER
1.0000 | Freq: Once | INTRAMUSCULAR | Status: AC
  Administered 2020-04-26: 1 via INTRAMUSCULAR

## 2020-04-26 NOTE — Patient Instructions (Signed)
Nice to see you.  We will check your lab work today.  You have received your second injection of Cabenuva. Please let us know if you wish to continue with monthly injections, switch to 2 month injections or return to oral medications.   Have a great day and stay safe!

## 2020-04-26 NOTE — Assessment & Plan Note (Signed)
Jonathan Hendricks has done well with his first injection of Cabenuva although experienced a significant amount of pain 2-3 days following injection. Discussed options for continued treatment and change of regimen if necessary. He wishes to continue with monthly injection at this point and will see how it goes in the next several days and will evaluate whether to stay on monthly injections, switch to every other month or return to orals. No signs/symptoms of opportunistic infection. Check viral load and CD4 count today. Injection provided without complication. Plan for follow up in 1 month or sooner if needed.
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
## 2020-04-30 LAB — HIV-1 RNA QUANT-NO REFLEX-BLD
HIV 1 RNA Quant: NOT DETECTED Copies/mL
HIV-1 RNA Quant, Log: NOT DETECTED Log cps/mL

## 2020-04-30 LAB — T-HELPER CELLS (CD4) COUNT (NOT AT ARMC)
Absolute CD4: 974 cells/uL (ref 490–1740)
CD4 T Helper %: 32 % (ref 30–61)
Total lymphocyte count: 3059 cells/uL (ref 850–3900)

## 2020-05-28 ENCOUNTER — Other Ambulatory Visit: Payer: Self-pay

## 2020-05-28 ENCOUNTER — Encounter: Payer: Self-pay | Admitting: Infectious Diseases

## 2020-05-28 ENCOUNTER — Ambulatory Visit (INDEPENDENT_AMBULATORY_CARE_PROVIDER_SITE_OTHER): Payer: Self-pay | Admitting: Infectious Diseases

## 2020-05-28 VITALS — BP 137/83 | HR 76 | Temp 97.9°F | Wt 176.0 lb

## 2020-05-28 DIAGNOSIS — B20 Human immunodeficiency virus [HIV] disease: Secondary | ICD-10-CM

## 2020-05-28 MED ORDER — CABOTEGRAVIR & RILPIVIRINE ER 400 & 600 MG/2ML IM SUER
1.0000 | Freq: Once | INTRAMUSCULAR | Status: AC
  Administered 2020-05-28: 1 via INTRAMUSCULAR
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
## 2020-05-28 NOTE — Assessment & Plan Note (Signed)
Mr. Kinzler is having a hard time with the Cabenuva (specifically Rilpivarine) injections. He feels that they may not be worth it. He does have lingering knot and pain a/w last shot on the right glut.  He would not consider the higher volume injection for Q17m interval for fear this may be a/w more pain. We discussed different injection techniques and positioning today to see if we can improve the injection site reactions. He will also try warm compresses / hot showers to see if that helps with the pain.  I asked him to please give [place] a call within 2 weeks to see what his decision is regarding continuing Cabenuva vs resuming previous or new ART.   The patient will return as expected for maintenance injections.  Will continue with Q89m viral load monitoring during first 6 months for therapeutic treatment monitoring.   Last VL result:  HIV 1 RNA Quant (Copies/mL)  Date Value  04/26/2020 Not Detected    Dose Interval: Q69m  Next Appointment: 06/25/2020

## 2020-05-28 NOTE — Patient Instructions (Signed)
We gave you your injection of CABENUVA for treatment today.   Your next appointment has been scheduled in 1 month on 06/25/2020.     Helpful Tips:  If you experience any knots under the skin please use a warm compress to help.  --> this could be a nice hot shower or bath, warm wash rags on your backside where the shots were.   Some people experience some redness at the site of the injection. This can be normal and should go away soon.   Moving around today is a good idea, if you sit too much it hurts more.   Please call the office to speak with our pharmacy or triage team if you have any questions or concerns --> give [place] a call within 2 weeks to let us know if you would rather go back on a pill once a day. That is OK and we are happy to help.

## 2020-06-19 ENCOUNTER — Telehealth: Payer: Self-pay | Admitting: Pharmacist

## 2020-06-19 NOTE — Telephone Encounter (Signed)
Patient's Cabenuva injection has arrived from [place] and is located in the med fridge. This will be administered at his next appointment on 5/3.

## 2020-06-25 ENCOUNTER — Other Ambulatory Visit: Payer: Self-pay

## 2020-06-25 ENCOUNTER — Encounter: Payer: Self-pay | Admitting: Infectious Diseases

## 2020-06-25 ENCOUNTER — Ambulatory Visit (INDEPENDENT_AMBULATORY_CARE_PROVIDER_SITE_OTHER): Payer: Self-pay | Admitting: Infectious Diseases

## 2020-06-25 DIAGNOSIS — B20 Human immunodeficiency virus [HIV] disease: Secondary | ICD-10-CM

## 2020-06-25 MED ORDER — CABOTEGRAVIR & RILPIVIRINE ER 400 & 600 MG/2ML IM SUER
1.0000 | Freq: Once | INTRAMUSCULAR | Status: AC
  Administered 2020-06-25: 1 via INTRAMUSCULAR
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
## 2020-06-25 NOTE — Patient Instructions (Signed)
Jonathan Hendricks, nice to see you today.   I rescheduled your next injection appointment with Dr. Daiva Eves on June 1st in the afternoon. You can do labs the same day at this visit as you discuss whether you want to continue the Cabenuva injections going forward.

## 2020-06-25 NOTE — Assessment & Plan Note (Signed)
Continues to have ISRs with Rilpivarine. Offered ventrogluteal vs dorsogluteal injection technique as I have one patient that reported significantly reduced ISRs with this intervention; he declined.   He is unlikely to tolerate higher volume injection for Q4m. Will proceed with injection today and have him back to see Dr. Daiva Eves to discuss next steps in 79m.   Last VL result:  HIV 1 RNA Quant (Copies/mL)  Date Value  04/26/2020 Not Detected    Dose Interval: Q39m Next Appointment: scheduled with Dr. Daiva Eves 07/24/2020

## 2020-07-08 ENCOUNTER — Telehealth: Payer: Self-pay

## 2020-07-08 NOTE — Telephone Encounter (Signed)
Patient complaining that he still has lumps from injections of Cabenuva. He discussed at OV with Durwin Nora that he will give this one last chance. Was offered ventrogluteal and declined. He is prepared to come on 07/24/20 to ov with plans to discuss pill form meds moving forward.

## 2020-07-16 NOTE — Telephone Encounter (Signed)
Had his injection on 06/25/2020. I administered it and again went over other ways to inject but he refused.

## 2020-07-16 NOTE — Telephone Encounter (Signed)
Provided he sees me before he goes out of the target date it is okay for him to discuss it with me in the clinic

## 2020-07-16 NOTE — Telephone Encounter (Signed)
Do you want to start him now so you can discuss what he feels like on them? Or wait for OV?

## 2020-07-17 ENCOUNTER — Telehealth: Payer: Self-pay

## 2020-07-17 ENCOUNTER — Other Ambulatory Visit: Payer: Medicaid Other

## 2020-07-17 NOTE — Telephone Encounter (Signed)
[facility] Patient Advocate Encounter  Patient's medication Renaldo Harrison) have been delivered to [facility] from [facility] and will be administered on patient next office visit on 07/24/20.  Clearance Coots , CPhT Specialty Pharmacy Patient [facility] Phone: [phone] Fax:  [phone]

## 2020-07-24 ENCOUNTER — Ambulatory Visit (INDEPENDENT_AMBULATORY_CARE_PROVIDER_SITE_OTHER): Payer: Self-pay | Admitting: Infectious Disease

## 2020-07-24 ENCOUNTER — Encounter: Payer: Self-pay | Admitting: Infectious Disease

## 2020-07-24 ENCOUNTER — Other Ambulatory Visit: Payer: Self-pay

## 2020-07-24 VITALS — BP 112/72 | HR 72 | Temp 98.0°F | Ht 71.0 in | Wt 172.0 lb

## 2020-07-24 DIAGNOSIS — F331 Major depressive disorder, recurrent, moderate: Secondary | ICD-10-CM

## 2020-07-24 DIAGNOSIS — B20 Human immunodeficiency virus [HIV] disease: Secondary | ICD-10-CM

## 2020-07-24 DIAGNOSIS — I10 Essential (primary) hypertension: Secondary | ICD-10-CM

## 2020-07-24 DIAGNOSIS — T8090XA Unspecified complication following infusion and therapeutic injection, initial encounter: Secondary | ICD-10-CM

## 2020-07-24 DIAGNOSIS — L239 Allergic contact dermatitis, unspecified cause: Secondary | ICD-10-CM

## 2020-07-24 MED ORDER — DOVATO 50-300 MG PO TABS: 1.0000 | ORAL_TABLET | Freq: Every day | ORAL | 11 refills | Status: DC

## 2020-07-24 MED ORDER — TRIAMCINOLONE ACETONIDE 0.5 % EX OINT: 1.0000 "application " | TOPICAL_OINTMENT | Freq: Two times a day (BID) | CUTANEOUS | 1 refills | Status: DC

## 2020-07-24 NOTE — Progress Notes (Signed)
Subjective:  Chief complaint: Complaining of multiple sore spots from [place] injections as well as worsening of his depression and a rash on his feet   Patient ID: Jonathan Hendricks, male    DOB: 1960/09/22, 60 y.o.   MRN: [id number]  HPI   60 year old Caucasian man living with HIV that has been well controlled on Triumeq who wished to switch to [place].  He did the oral lead-in and then was given loading dose and eventually switched over to every month dosing.  He was having injection site reactions particularly at the ropivacaine site and these have not improved over time and has become quite unhappy about it.  He says his depression is also worsened.  He also seems to not like having to come to clinic so frequently to get the injections.  He would like to come off injections and going to oral medications and I would like to place him on Dovato.  I did spend a good up till the time explained to him that Dovato was essentially the same as TRIUMEQ but minus the abacavir.    Past Medical History:  Diagnosis Date  . Aphthous ulcer 02/14/2020  . Constipation   . Depression   . Emotional lability 07/16/2014  . Gynecomastia 07/16/2014  . HIV disease ([place])   . Hypertension   . IBD (inflammatory bowel disease)   . Insomnia 08/29/2014  . Need for prophylactic vaccination against Streptococcus pneumoniae (pneumococcus) 08/24/2017  . Pain in joint, shoulder region 06/18/2015  . Painful legs and moving toes 08/17/2016  . Rectal pain   . Recurrent major depression-severe ([place]) 07/16/2014  . Sexual problems 12/14/2016    No past surgical history on file.  Family History  Problem Relation Age of Onset  . Heart disease Mother       Social History   Socioeconomic History  . Marital status: Single    Spouse name: Not on file  . Number of children: Not on file  . Years of education: Not on file  . Highest education level: Not on file  Occupational History  . Not on file  Tobacco Use  .  Smoking status: Never Smoker  . Smokeless tobacco: Never Used  Substance and Sexual Activity  . Alcohol use: Yes    Alcohol/week: 0.0 standard drinks    Comment: OCCAS.  . Drug use: No  . Sexual activity: Not Currently    Partners: Male    Birth control/protection: Condom    Comment: patient declined condoms  Other Topics Concern  . Not on file  Social History Narrative  . Not on file   Social Determinants of Health   Financial Resource Strain: Not on file  Food Insecurity: Not on file  Transportation Needs: Not on file  Physical Activity: Not on file  Stress: Not on file  Social Connections: Not on file    No Known Allergies   Current Outpatient Medications:  .  cabotegravir & rilpivirine ER (CABENUVA) 400 & 600 MG/2ML injection, Inject 1 kit into the muscle every 28 (twenty-eight) days., Disp: 4 mL, Rfl: 5 .  lisinopril-hydrochlorothiazide (ZESTORETIC) 20-25 MG tablet, TAKE 1 TABLET BY MOUTH DAILY, Disp: 90 tablet, Rfl: 1 .  valACYclovir (VALTREX) 500 MG tablet, TAKE 1 TABLET(500 MG) BY MOUTH DAILY, Disp: 90 tablet, Rfl: 3   Review of Systems  Constitutional: Negative for activity change, appetite change, chills, diaphoresis, fatigue, fever and unexpected weight change.  HENT: Negative for congestion, rhinorrhea, sinus pressure, sneezing, sore  throat and trouble swallowing.   Eyes: Negative for photophobia and visual disturbance.  Respiratory: Negative for cough, chest tightness, shortness of breath, wheezing and stridor.   Cardiovascular: Negative for chest pain, palpitations and leg swelling.  Gastrointestinal: Negative for abdominal distention, abdominal pain, anal bleeding, blood in stool, constipation, diarrhea, nausea and vomiting.  Genitourinary: Negative for difficulty urinating, dysuria, flank pain and hematuria.  Musculoskeletal: Positive for myalgias. Negative for arthralgias, back pain, gait problem and joint swelling.  Skin: Positive for rash. Negative for  color change, pallor and wound.  Neurological: Negative for dizziness, tremors, weakness and light-headedness.  Hematological: Negative for adenopathy. Does not bruise/bleed easily.  Psychiatric/Behavioral: Positive for behavioral problems, confusion and dysphoric mood. Negative for agitation, decreased concentration and sleep disturbance.   He has a rash on his feet that is symmetrical and is not responded to antifungal therapy        Objective:   Physical Exam Constitutional:      Appearance: He is well-developed.  HENT:     Head: Normocephalic and atraumatic.  Eyes:     Conjunctiva/sclera: Conjunctivae normal.  Cardiovascular:     Rate and Rhythm: Normal rate and regular rhythm.  Pulmonary:     Effort: Pulmonary effort is normal. No respiratory distress.     Breath sounds: No wheezing.  Abdominal:     General: There is no distension.     Palpations: Abdomen is soft.  Musculoskeletal:        General: No tenderness. Normal range of motion.     Cervical back: Normal range of motion and neck supple.  Skin:    General: Skin is warm and dry.     Coloration: Skin is not pale.     Findings: No erythema or rash.  Neurological:     General: No focal deficit present.     Mental Status: He is alert and oriented to person, place, and time.  Psychiatric:        Attention and Perception: Attention normal.        Mood and Affect: Mood is depressed. Affect is labile.        Speech: Speech normal.        Behavior: Behavior is cooperative.        Cognition and Memory: Cognition and memory normal.        Judgment: Judgment normal.           Assessment & Plan:   HIV disease:   Check HIV VL w reflex genotype today  Start Dovato  RTC in July for visit with me labs and renew HMAP  HTN: better controlled  Depression: worse, which could be due to cabotegravir, but not self or rilpivirine or the fact that he is having these injection site reactions that he dislikes so  much.  Hopefully stopping [place] injectables will help him out though he has hx of depression and labile mood in the past.  Injection site reaction: This should eventually go away now that he is coming off drug.  Need for vaccine he needs a pneumococcal vaccine but he did not want to have that done today.  Contact dermatitis try triamcinolone topical ointment.  I spent greater than 40 minutes with the patient including greater than 50% of time in face to face counsel of the patient and in coordination of his care.

## 2020-07-30 LAB — HIV RNA, RTPCR W/R GT (RTI, PI,INT)
HIV 1 RNA Quant: NOT DETECTED copies/mL
HIV-1 RNA Quant, Log: NOT DETECTED Log copies/mL

## 2020-07-31 ENCOUNTER — Encounter: Payer: Medicaid Other | Admitting: Infectious Diseases

## 2020-08-13 ENCOUNTER — Other Ambulatory Visit (HOSPITAL_COMMUNITY): Payer: Self-pay

## 2020-08-13 MED ORDER — SODIUM FLUORIDE 1.1 % DT CREA
TOPICAL_CREAM | DENTAL | 3 refills | Status: DC
  Filled 2020-08-13: qty 51, 30d supply, fill #0

## 2020-08-27 ENCOUNTER — Encounter: Payer: Medicaid Other | Admitting: Infectious Diseases

## 2020-08-29 ENCOUNTER — Other Ambulatory Visit: Payer: Self-pay

## 2020-08-29 DIAGNOSIS — I10 Essential (primary) hypertension: Secondary | ICD-10-CM

## 2020-08-29 MED ORDER — LISINOPRIL-HYDROCHLOROTHIAZIDE 20-25 MG PO TABS: 1.0000 | ORAL_TABLET | Freq: Every day | ORAL | 1 refills | Status: DC

## 2020-08-30 ENCOUNTER — Encounter: Payer: Self-pay | Admitting: *Deleted

## 2020-08-30 ENCOUNTER — Ambulatory Visit: Payer: Self-pay

## 2020-08-30 ENCOUNTER — Other Ambulatory Visit (HOSPITAL_COMMUNITY)
Admission: RE | Admit: 2020-08-30 | Discharge: 2020-08-30 | Disposition: A | Discharge status: Home / Self Care | Payer: Medicaid Other | Source: Ambulatory Visit | Attending: Infectious Disease | Admitting: Infectious Disease

## 2020-08-30 ENCOUNTER — Ambulatory Visit (INDEPENDENT_AMBULATORY_CARE_PROVIDER_SITE_OTHER): Payer: Self-pay | Admitting: Infectious Disease

## 2020-08-30 ENCOUNTER — Encounter: Payer: Self-pay | Admitting: Infectious Disease

## 2020-08-30 ENCOUNTER — Other Ambulatory Visit: Payer: Self-pay

## 2020-08-30 VITALS — BP 119/78 | HR 73 | Temp 98.7°F | Wt 174.0 lb

## 2020-08-30 DIAGNOSIS — B20 Human immunodeficiency virus [HIV] disease: Secondary | ICD-10-CM | POA: Insufficient documentation

## 2020-08-30 DIAGNOSIS — K649 Unspecified hemorrhoids: Secondary | ICD-10-CM

## 2020-08-30 DIAGNOSIS — K648 Other hemorrhoids: Secondary | ICD-10-CM

## 2020-08-30 DIAGNOSIS — F332 Major depressive disorder, recurrent severe without psychotic features: Secondary | ICD-10-CM

## 2020-08-30 DIAGNOSIS — K629 Disease of anus and rectum, unspecified: Secondary | ICD-10-CM

## 2020-08-30 DIAGNOSIS — R197 Diarrhea, unspecified: Secondary | ICD-10-CM

## 2020-08-30 DIAGNOSIS — I10 Essential (primary) hypertension: Secondary | ICD-10-CM

## 2020-08-30 HISTORY — DX: Unspecified hemorrhoids: K64.9

## 2020-08-30 HISTORY — DX: Disease of anus and rectum, unspecified: K62.9

## 2020-08-30 NOTE — Progress Notes (Signed)
Subjective:  Chief complaint: Painful area in his rectum over the last few weeks with some blood with wiping toilet paper this morning   Patient ID: Jonathan Hendricks, male    DOB: 1960/05/17, 60 y.o.   MRN: [id number]  HPI   60 year old Caucasian man living with HIV that has been well controlled on Triumeq who wished to switch to [place].  He did the oral lead-in and then was given loading dose and eventually switched over to every month dosing.  He was having injection site reactions particularly at the Rilpivirine site and these have not improved over time and had become quite unhappy about it.  He s he also told me that his depression had worsened and that he did not like having to go come some frequently for injections.  We therefore took him off of long-acting antiretrovirals and placed him on Dovato.  That he had some loose stools after reinitiating oral antiretrovirals for a few days but these then subsided.  He has noticed in the last 2 weeks a painful area which he experiences in the verge of his rectum which he is concerned might be a genital wart.  Is quite painful on one area if he touches it but not painful to sit.  When he has had bowel movements he has had some pain and in particular he noticed this morning that when he wiped the some blood came out which he presumed came from this area.  He had had receptive anal intercourse several weeks prior to development of the symptoms of pain here.      Past Medical History:  Diagnosis Date   Anal lesion 08/30/2020   Aphthous ulcer 02/14/2020   Constipation    Depression    Emotional lability 07/16/2014   Gynecomastia 07/16/2014   Hemorrhoid 08/30/2020   HIV disease (HCC)    Hypertension    IBD (inflammatory bowel disease)    Insomnia 08/29/2014   Need for prophylactic vaccination against Streptococcus pneumoniae (pneumococcus) 08/24/2017   Pain in joint, shoulder region 06/18/2015   Painful legs and moving toes 08/17/2016    Rectal pain    Recurrent major depression-severe (HCC) 07/16/2014   Sexual problems 12/14/2016    No past surgical history on file.  Family History  Problem Relation Age of Onset   Heart disease Mother       Social History   Socioeconomic History   Marital status: Single    Spouse name: Not on file   Number of children: Not on file   Years of education: Not on file   Highest education level: Not on file  Occupational History   Not on file  Tobacco Use   Smoking status: Never   Smokeless tobacco: Never  Substance and Sexual Activity   Alcohol use: Yes    Alcohol/week: 0.0 standard drinks    Comment: OCCAS.   Drug use: No   Sexual activity: Not Currently    Partners: Male    Birth control/protection: Condom    Comment: patient declined condoms  Other Topics Concern   Not on file  Social History Narrative   Not on file   Social Determinants of Health   Financial Resource Strain: Not on file  Food Insecurity: Not on file  Transportation Needs: Not on file  Physical Activity: Not on file  Stress: Not on file  Social Connections: Not on file    No Known Allergies   Current Outpatient Medications:    Dolutegravir-lamiVUDine (  DOVATO) 50-300 MG TABS, Take 1 tablet by mouth daily., Disp: 30 tablet, Rfl: 11   lisinopril-hydrochlorothiazide (ZESTORETIC) 20-25 MG tablet, Take 1 tablet by mouth daily., Disp: 90 tablet, Rfl: 1   sodium fluoride (SODIUM FLUORIDE 5000 PLUS) 1.1 % CREA dental cream, Use peasize of this toothpaste and brush teeth at evening oral care. Spit out excess and do NOT rinse with water., Disp: 51 g, Rfl: 3   valACYclovir (VALTREX) 500 MG tablet, TAKE 1 TABLET(500 MG) BY MOUTH DAILY, Disp: 90 tablet, Rfl: 3   triamcinolone ointment (KENALOG) 0.5 %, Apply 1 application topically 2 (two) times daily. (Patient not taking: Reported on 08/30/2020), Disp: 30 g, Rfl: 1   Review of Systems  Constitutional:  Negative for chills, diaphoresis and fever.  HENT:   Negative for congestion, hearing loss, sore throat and tinnitus.   Respiratory:  Negative for cough, shortness of breath and wheezing.   Cardiovascular:  Negative for chest pain, palpitations and leg swelling.  Gastrointestinal:  Positive for anal bleeding and rectal pain. Negative for abdominal pain, blood in stool, constipation, diarrhea, nausea and vomiting.  Genitourinary:  Negative for dysuria, flank pain and hematuria.  Musculoskeletal:  Negative for back pain.  Neurological:  Negative for dizziness, weakness and headaches.  Hematological:  Does not bruise/bleed easily.  Psychiatric/Behavioral:  Negative for behavioral problems, confusion and suicidal ideas. The patient is not nervous/anxious.   He has a rash on his feet that is symmetrical and is not responded to antifungal therapy        Objective:   Physical Exam Constitutional:      General: He is not in acute distress.    Appearance: Normal appearance. He is well-developed. He is not ill-appearing or diaphoretic.  HENT:     Head: Normocephalic and atraumatic.     Right Ear: Hearing and external ear normal.     Left Ear: Hearing and external ear normal.     Nose: No nasal deformity or rhinorrhea.  Eyes:     General: No scleral icterus.    Conjunctiva/sclera: Conjunctivae normal.     Right eye: Right conjunctiva is not injected.     Left eye: Left conjunctiva is not injected.     Pupils: Pupils are equal, round, and reactive to light.  Neck:     Vascular: No JVD.  Cardiovascular:     Rate and Rhythm: Normal rate and regular rhythm.     Heart sounds: Normal heart sounds, S1 normal and S2 normal. No murmur heard.   No friction rub.  Pulmonary:     Effort: Pulmonary effort is normal. No respiratory distress.     Breath sounds: No wheezing.  Abdominal:     General: There is no distension.     Palpations: Abdomen is soft.     Tenderness: There is no abdominal tenderness.  Musculoskeletal:        General: Normal range  of motion.     Right shoulder: Normal.     Left shoulder: Normal.     Cervical back: Normal range of motion and neck supple.     Right hip: Normal.     Left hip: Normal.     Right knee: Normal.     Left knee: Normal.  Lymphadenopathy:     Head:     Right side of head: No submandibular, preauricular or posterior auricular adenopathy.     Left side of head: No submandibular, preauricular or posterior auricular adenopathy.     Cervical: No  cervical adenopathy.     Right cervical: No superficial or deep cervical adenopathy.    Left cervical: No superficial or deep cervical adenopathy.  Skin:    General: Skin is warm and dry.     Coloration: Skin is not pale.     Findings: No abrasion, bruising, ecchymosis, erythema, lesion or rash.     Nails: There is no clubbing.  Neurological:     General: No focal deficit present.     Mental Status: He is alert and oriented to person, place, and time.     Sensory: No sensory deficit.     Coordination: Coordination normal.     Gait: Gait normal.  Psychiatric:        Attention and Perception: He is attentive.        Mood and Affect: Mood normal. Affect is not labile.        Speech: Speech normal.        Behavior: Behavior normal. Behavior is cooperative.        Thought Content: Thought content normal.        Cognition and Memory: Cognition normal.        Judgment: Judgment normal.     Rectal lesion August 30, 2020:        Assessment & Plan:   Anal lesion: Seems like a grade 1 hemorrhoid.  Can try the topical agents which she has at home and stool bulking agent so is not having particular pain with defecation more with the area being palpated.  I have referred him to general surgery in [facility] so he can establish with them in case he needs any surgical interventions.    HIV disease: Check viral load and CD4 count after switch to Dovato    HTN: better controlled  Depression: Seems better today.  Injection site reaction:  ressolved  I spent more than with the patient including face to face counseling of the patient personally reviewing radiographs, along with pertinent laboratory microbiological, virological data review of medical records before and during the visit and in coordination of his care.

## 2020-09-02 LAB — COMPLETE METABOLIC PANEL WITH GFR
AG Ratio: 1.2 (calc) (ref 1.0–2.5)
ALT: 9 U/L (ref 9–46)
AST: 14 U/L (ref 10–35)
Albumin: 4.4 g/dL (ref 3.6–5.1)
Alkaline phosphatase (APISO): 101 U/L (ref 35–144)
BUN/Creatinine Ratio: 10 (calc) (ref 6–22)
BUN: 14 mg/dL (ref 7–25)
CO2: 28 mmol/L (ref 20–32)
Calcium: 9.3 mg/dL (ref 8.6–10.3)
Chloride: 101 mmol/L (ref 98–110)
Creat: 1.43 mg/dL — ABNORMAL HIGH (ref 0.70–1.33)
GFR, Est African American: 62 mL/min/{1.73_m2} (ref >=60)
GFR, Est Non African American: 53 mL/min/{1.73_m2} — ABNORMAL LOW (ref >=60)
Globulin: 3.7 g/dL (calc) (ref 1.9–3.7)
Glucose, Bld: 79 mg/dL (ref 65–99)
Potassium: 3.9 mmol/L (ref 3.5–5.3)
Sodium: 139 mmol/L (ref 135–146)
Total Bilirubin: 1 mg/dL (ref 0.2–1.2)
Total Protein: 8.1 g/dL (ref 6.1–8.1)

## 2020-09-02 LAB — URINE CYTOLOGY ANCILLARY ONLY
Chlamydia: NEGATIVE
Comment: NEGATIVE
Comment: NORMAL
Neisseria Gonorrhea: NEGATIVE

## 2020-09-02 LAB — T-HELPER CELLS (CD4) COUNT (NOT AT ARMC)
Absolute CD4: 665 cells/uL (ref 490–1740)
CD4 T Helper %: 35 % (ref 30–61)
Total lymphocyte count: 1875 cells/uL (ref 850–3900)

## 2020-09-02 LAB — CYTOLOGY, (ORAL, ANAL, URETHRAL) ANCILLARY ONLY
Chlamydia: NEGATIVE
Chlamydia: NEGATIVE
Comment: NEGATIVE
Comment: NEGATIVE
Comment: NORMAL
Comment: NORMAL
Neisseria Gonorrhea: NEGATIVE
Neisseria Gonorrhea: NEGATIVE

## 2020-09-02 LAB — CBC WITH DIFFERENTIAL/PLATELET
Absolute Monocytes: 663 cells/uL (ref 200–950)
Basophils Absolute: 31 cells/uL (ref 0–200)
Basophils Relative: 0.6 %
Eosinophils Absolute: 51 cells/uL (ref 15–500)
Eosinophils Relative: 1 %
HCT: 42.8 % (ref 38.5–50.0)
Hemoglobin: 14.3 g/dL (ref 13.2–17.1)
Lymphs Abs: 1943 cells/uL (ref 850–3900)
MCH: 30 pg (ref 27.0–33.0)
MCHC: 33.4 g/dL (ref 32.0–36.0)
MCV: 89.7 fL (ref 80.0–100.0)
MPV: 9.9 fL (ref 7.5–12.5)
Monocytes Relative: 13 %
Neutro Abs: 2412 cells/uL (ref 1500–7800)
Neutrophils Relative %: 47.3 %
Platelets: 294 10*3/uL (ref 140–400)
RBC: 4.77 10*6/uL (ref 4.20–5.80)
RDW: 13.8 % (ref 11.0–15.0)
Total Lymphocyte: 38.1 %
WBC: 5.1 10*3/uL (ref 3.8–10.8)

## 2020-09-02 LAB — HIV-1 RNA QUANT-NO REFLEX-BLD
HIV 1 RNA Quant: 23 Copies/mL — ABNORMAL HIGH
HIV-1 RNA Quant, Log: 1.36 Log cps/mL — ABNORMAL HIGH

## 2020-09-18 ENCOUNTER — Ambulatory Visit (INDEPENDENT_AMBULATORY_CARE_PROVIDER_SITE_OTHER): Payer: Self-pay | Admitting: Physician Assistant

## 2020-09-18 ENCOUNTER — Encounter: Payer: Self-pay | Admitting: Physician Assistant

## 2020-09-18 ENCOUNTER — Other Ambulatory Visit: Payer: Self-pay

## 2020-09-18 VITALS — BP 122/69 | HR 77 | Temp 98.3°F | Wt 176.0 lb

## 2020-09-18 DIAGNOSIS — A539 Syphilis, unspecified: Secondary | ICD-10-CM

## 2020-09-18 DIAGNOSIS — R634 Abnormal weight loss: Secondary | ICD-10-CM

## 2020-09-18 DIAGNOSIS — F321 Major depressive disorder, single episode, moderate: Secondary | ICD-10-CM

## 2020-09-18 DIAGNOSIS — K626 Ulcer of anus and rectum: Secondary | ICD-10-CM

## 2020-09-18 DIAGNOSIS — Z202 Contact with and (suspected) exposure to infections with a predominantly sexual mode of transmission: Secondary | ICD-10-CM

## 2020-09-18 MED ORDER — PENICILLIN G BENZATHINE 1200000 UNIT/2ML IM SUSY
1.2000 10*6.[IU] | PREFILLED_SYRINGE | Freq: Once | INTRAMUSCULAR | Status: AC
  Administered 2020-09-18: 1.2 10*6.[IU] via INTRAMUSCULAR

## 2020-09-18 MED ORDER — LIDOCAINE-PRILOCAINE 2.5-2.5 % EX CREA: 1.0000 "application " | TOPICAL_CREAM | CUTANEOUS | 0 refills | Status: DC | PRN

## 2020-09-18 NOTE — Patient Instructions (Addendum)
Very nice to meet you Bicillin injection given today as your lesion is suspicious for ulcer related to syphilis- labs today Referral to internal medicine I suspect the weight loss may be contributed to depression Follow up as directed with [facility] as instructed
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
## 2020-09-19 ENCOUNTER — Telehealth: Payer: Self-pay | Admitting: Physician Assistant

## 2020-09-19 NOTE — Telephone Encounter (Signed)
Spoke to pt regarding results. Confirmed syphilis, bicillin 2.4 million units given in clinic 09/18/2020-no need for additional treatment.  Has appt with Dr. Daiva Eves 11/2020 will re-check titer at the 3 month time frame.  He will notify partners. Encouraged to avoid sexual activity for two weeks following treatment and to use condoms. All questions answered.

## 2020-09-20 LAB — COMPLETE METABOLIC PANEL WITH GFR
AG Ratio: 0.9 (calc) — ABNORMAL LOW (ref 1.0–2.5)
ALT: 9 U/L (ref 9–46)
AST: 12 U/L (ref 10–35)
Albumin: 3.8 g/dL (ref 3.6–5.1)
Alkaline phosphatase (APISO): 106 U/L (ref 35–144)
BUN/Creatinine Ratio: 10 (calc) (ref 6–22)
BUN: 15 mg/dL (ref 7–25)
CO2: 31 mmol/L (ref 20–32)
Calcium: 9.2 mg/dL (ref 8.6–10.3)
Chloride: 102 mmol/L (ref 98–110)
Creat: 1.48 mg/dL — ABNORMAL HIGH (ref 0.70–1.30)
Globulin: 4.2 g/dL (calc) — ABNORMAL HIGH (ref 1.9–3.7)
Glucose, Bld: 86 mg/dL (ref 65–99)
Potassium: 4.3 mmol/L (ref 3.5–5.3)
Sodium: 140 mmol/L (ref 135–146)
Total Bilirubin: 0.7 mg/dL (ref 0.2–1.2)
Total Protein: 8 g/dL (ref 6.1–8.1)
eGFR: 54 mL/min/{1.73_m2} — ABNORMAL LOW (ref >=60)

## 2020-09-20 LAB — CBC WITH DIFFERENTIAL/PLATELET
Absolute Monocytes: 580 cells/uL (ref 200–950)
Basophils Absolute: 41 cells/uL (ref 0–200)
Basophils Relative: 0.7 %
Eosinophils Absolute: 110 cells/uL (ref 15–500)
Eosinophils Relative: 1.9 %
HCT: 39.6 % (ref 38.5–50.0)
Hemoglobin: 13.6 g/dL (ref 13.2–17.1)
Lymphs Abs: 1676 cells/uL (ref 850–3900)
MCH: 30.4 pg (ref 27.0–33.0)
MCHC: 34.3 g/dL (ref 32.0–36.0)
MCV: 88.6 fL (ref 80.0–100.0)
MPV: 9.3 fL (ref 7.5–12.5)
Monocytes Relative: 10 %
Neutro Abs: 3393 cells/uL (ref 1500–7800)
Neutrophils Relative %: 58.5 %
Platelets: 416 10*3/uL — ABNORMAL HIGH (ref 140–400)
RBC: 4.47 10*6/uL (ref 4.20–5.80)
RDW: 13.6 % (ref 11.0–15.0)
Total Lymphocyte: 28.9 %
WBC: 5.8 10*3/uL (ref 3.8–10.8)

## 2020-09-20 LAB — TSH: TSH: 1.48 mIU/L (ref 0.40–4.50)

## 2020-09-20 LAB — RPR TITER: RPR Titer: 1:512 {titer} — ABNORMAL HIGH

## 2020-09-20 LAB — FLUORESCENT TREPONEMAL AB(FTA)-IGG-BLD: Fluorescent Treponemal ABS: REACTIVE — AB

## 2020-09-20 LAB — RPR: RPR Ser Ql: REACTIVE — AB

## 2020-10-08 DIAGNOSIS — Z3009 Encounter for other general counseling and advice on contraception: Secondary | ICD-10-CM | POA: Diagnosis not present

## 2020-10-08 DIAGNOSIS — Z0389 Encounter for observation for other suspected diseases and conditions ruled out: Secondary | ICD-10-CM | POA: Diagnosis not present

## 2020-10-08 DIAGNOSIS — Z1388 Encounter for screening for disorder due to exposure to contaminants: Secondary | ICD-10-CM | POA: Diagnosis not present

## 2020-10-16 ENCOUNTER — Ambulatory Visit: Payer: Self-pay | Admitting: Internal Medicine

## 2020-10-16 ENCOUNTER — Encounter: Payer: Self-pay | Admitting: Internal Medicine

## 2020-10-16 VITALS — BP 131/80 | HR 79 | Temp 98.2°F | Ht 71.0 in | Wt 176.1 lb

## 2020-10-16 DIAGNOSIS — F332 Major depressive disorder, recurrent severe without psychotic features: Secondary | ICD-10-CM

## 2020-10-16 DIAGNOSIS — N1831 Chronic kidney disease, stage 3a: Secondary | ICD-10-CM

## 2020-10-16 DIAGNOSIS — K648 Other hemorrhoids: Secondary | ICD-10-CM

## 2020-10-16 NOTE — Assessment & Plan Note (Signed)
Patient is here to establish care. On review of kidney function he has stable CKD stage 3a . He has HTN , but controlled on antihypertensive. Last hemoglobin a1c in 2018. Patient reports his CKD is secondary to one of his earlier HIV medications. This is consistent with stable kidney function. Patient is applying for orange card and will check for proteinuria at next visit. He is on combination pill which includes lisinopril.

## 2020-10-16 NOTE — Progress Notes (Deleted)
   CC: ***  HPI:Jonathan Hendricks is a 60 y.o. male who presents for evaluation of ***. Please see individual problem based A/P for details.  Internal hemmroid   Depression, PHQ-9: Based on the patients  Flowsheet Row Office Visit from 07/24/2020 in [facility]  PHQ-9 Total Score 0      score we have ***.  Past Medical History:  Diagnosis Date   Anal lesion 08/30/2020   Aphthous ulcer 02/14/2020   Constipation    Depression    Emotional lability 07/16/2014   Gynecomastia 07/16/2014   Hemorrhoid 08/30/2020   HIV disease (HCC)    Hypertension    IBD (inflammatory bowel disease)    Insomnia 08/29/2014   Need for prophylactic vaccination against Streptococcus pneumoniae (pneumococcus) 08/24/2017   Pain in joint, shoulder region 06/18/2015   Painful legs and moving toes 08/17/2016   Rectal pain    Recurrent major depression-severe (HCC) 07/16/2014   Sexual problems 12/14/2016   Review of Systems:   ROS   Physical Exam: Vitals:   10/16/20 1317  BP: 131/80  Pulse: 79  Temp: 98.2 F (36.8 C)  TempSrc: Oral  SpO2: 99%  Weight: 176 lb 1.6 oz (79.9 kg)  Height: 5\' 11"  (1.803 m)     General: *** HEENT: Conjunctiva nl , antiicteric sclerae, moist mucous membranes, no exudate or erythema Cardiovascular: Normal rate, regular rhythm.  No murmurs, rubs, or gallops Pulmonary : Equal breath sounds, No wheezes, rales, or rhonchi Abdominal: soft, nontender,  bowel sounds present Ext: No edema in lower extremities, no tenderness to palpation of lower extremities.   Assessment & Plan:   See Encounters Tab for problem based charting.  Patient {GC/GE:[id number]::"discussed with","seen with"} Dr. . Hoffman","Guilloud","Mullen","Narendra","Raines","Vincent","Williams"}

## 2020-10-16 NOTE — Patient Instructions (Signed)
Jonathan Hendricks follow up with me in 3 months. When you have the orange card we will check some lab work and consider if you need a referral.

## 2020-10-16 NOTE — Assessment & Plan Note (Signed)
Patient recently had an anal verge ulceration secondary to STD.  He was seen by a surgeon at [facility].  He reports a separate problem of a internal hemorrhoid which cannot appreciate on exam.  Patient reports he can feel the hemorrhoid at 5 o'clock position. I discussed with patient I recommend continued conservative therapy at this time. Patient reports he did not receive evaluation with anoscope and this was a mistake in documentation. He is going to health department today and I recommended rechecking for STD's today. He would prefer testing there due to cost. Patient is applying for orange card, which is a financial assistance program. He would need to be seen by surgeon again if continuing to have pain for better evaluation of rectum.

## 2020-10-16 NOTE — Progress Notes (Signed)
   CC: Hemorrhoid, CKD stage 3, recurrent  major depression  HPI:Mr.AMMON MUSCATELLO is a 60 y.o. male who presents for evaluation of hemorrhoid, CKD stage 3, and recurrent major depression. Please see individual problem based A/P for details.   Past Medical History:  Diagnosis Date   Anal lesion 08/30/2020   Aphthous ulcer 02/14/2020   Constipation    Depression    Emotional lability 07/16/2014   Gynecomastia 07/16/2014   Hemorrhoid 08/30/2020   HIV disease (HCC)    Hypertension    IBD (inflammatory bowel disease)    Insomnia 08/29/2014   Need for prophylactic vaccination against Streptococcus pneumoniae (pneumococcus) 08/24/2017   Pain in joint, shoulder region 06/18/2015   Painful legs and moving toes 08/17/2016   Rectal pain    Recurrent major depression-severe (HCC) 07/16/2014   Sexual problems 12/14/2016   Past Surgical History:  Procedure Laterality Date   TONSILLECTOMY Bilateral    Family History  Problem Relation Age of Onset   Heart disease Mother    Social History   Tobacco Use   Smoking status: Never   Smokeless tobacco: Never  Substance Use Topics   Alcohol use: Not Currently    Comment: OCCAS.   Drug use: No     Review of Systems:   Review of Systems  Constitutional:  Negative for chills and fever.  HENT:  Negative for sinus pain and sore throat.   Eyes:  Negative for double vision and pain.  Respiratory:  Negative for cough and shortness of breath.   Cardiovascular:  Negative for chest pain and leg swelling.  Gastrointestinal:  Positive for diarrhea. Negative for abdominal pain, blood in stool and constipation. Melena: Not recently. Genitourinary:  Negative for frequency and urgency.  Musculoskeletal:  Negative for falls and myalgias.  Skin:  Negative for itching and rash.  Neurological:  Negative for dizziness and headaches.  Psychiatric/Behavioral:  Positive for depression. Negative for substance abuse.     Physical Exam: Vitals:   10/16/20 1317  BP:  131/80  Pulse: 79  Temp: 98.2 F (36.8 C)  TempSrc: Oral  SpO2: 99%  Weight: 176 lb 1.6 oz (79.9 kg)  Height: 5\' 11"  (1.803 m)   Physical Exam Constitutional:      Appearance: Normal appearance.  HENT:     Right Ear: External ear normal.     Left Ear: External ear normal.     Nose: No congestion.     Mouth/Throat:     Mouth: Mucous membranes are moist.     Pharynx: No oropharyngeal exudate.  Eyes:     Conjunctiva/sclera: Conjunctivae normal.  Cardiovascular:     Rate and Rhythm: Normal rate and regular rhythm.  Pulmonary:     Effort: Pulmonary effort is normal.     Breath sounds: Normal breath sounds.  Abdominal:     General: Bowel sounds are normal. There is no distension.     Palpations: Abdomen is soft.  Genitourinary:    Rectum: Tenderness present. No mass, anal fissure, external hemorrhoid or internal hemorrhoid.  Skin:    General: Skin is warm and dry.  Neurological:     Mental Status: He is alert.    Assessment & Plan:   See Encounters Tab for problem based charting.  Patient discussed with Dr. 

## 2020-10-17 ENCOUNTER — Encounter: Payer: Self-pay | Admitting: Internal Medicine
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
## 2020-10-22 NOTE — Progress Notes (Signed)
Internal Medicine Clinic Attending  Case discussed with Dr. Steen  At the time of the visit.  We reviewed the resident's history and exam and pertinent patient test results.  I agree with the assessment, diagnosis, and plan of care documented in the resident's note.  

## 2020-11-14 ENCOUNTER — Telehealth: Payer: Self-pay

## 2020-11-14 NOTE — Telephone Encounter (Signed)
-----   Message from [place] sent at 11/14/2020 11:34 AM EDT ----- Scheduled patient for labs on the 27th, he is wanting the STD panel done with his lab testing as well.

## 2020-11-14 NOTE — Telephone Encounter (Signed)
Patient requesting STI testing at lab appointment on 09/27. I contacted patient to see if he was having sx's. Pt reported having rectal and penile pain/burning. I offered patient an appointment to come in today or tomorrow to be seen - patient declined due to going out of town. Patient agreed to come in Tuesday 9/27 to see [name], NP for STI testing and to have labs done.    [name], CMA

## 2020-11-19 ENCOUNTER — Other Ambulatory Visit: Payer: Self-pay

## 2020-11-19 ENCOUNTER — Other Ambulatory Visit: Payer: Self-pay | Admitting: Infectious Disease

## 2020-11-19 ENCOUNTER — Ambulatory Visit (INDEPENDENT_AMBULATORY_CARE_PROVIDER_SITE_OTHER): Payer: Self-pay | Admitting: Infectious Diseases

## 2020-11-19 ENCOUNTER — Encounter: Payer: Self-pay | Admitting: Infectious Diseases

## 2020-11-19 VITALS — BP 116/76 | HR 64 | Temp 98.2°F | Wt 175.6 lb

## 2020-11-19 DIAGNOSIS — K629 Disease of anus and rectum, unspecified: Secondary | ICD-10-CM

## 2020-11-19 DIAGNOSIS — K648 Other hemorrhoids: Secondary | ICD-10-CM

## 2020-11-19 DIAGNOSIS — B20 Human immunodeficiency virus [HIV] disease: Secondary | ICD-10-CM

## 2020-11-19 DIAGNOSIS — R300 Dysuria: Secondary | ICD-10-CM

## 2020-11-19 DIAGNOSIS — Z8619 Personal history of other infectious and parasitic diseases: Secondary | ICD-10-CM

## 2020-11-19 NOTE — Patient Instructions (Signed)
Will see what your swabs and blood tests show  Please keep your appointment with Dr. Daiva Eves in a few weeks.

## 2020-11-19 NOTE — Assessment & Plan Note (Signed)
Will repeat RPR today to follow titer from 72m ago after treatment.  Previous syphilitic chancre to anus has resolved.

## 2020-11-19 NOTE — Progress Notes (Signed)
Subjective:     Jonathan Hendricks is a 60 y.o. male patient here today with concern over STI exposure/symptoms.    Chief Complaint  Patient presents with   Follow-up    Pt requested STD testing last week but rectal and penile burning has improved.      Called last week when he was experiencing urinary and rectal burning. This has since resolved. No fevers, chills, lymphadenopathy or rashes have evolved since then. He feels well and is without any symptoms today. He has several questions about possible transmission of other HIV strains to him from uncontrolled partners. Does not always use condoms with sex.  No rectal masses or warts. Ulceration after syphilis treatment has resolved.  Has had some diarrhea off and on since switching back to his oral ARV.     Review of Systems: Review of Systems  Constitutional:  Negative for chills and fever.  HENT:  Negative for sore throat.   Eyes:  Negative for visual disturbance.  Gastrointestinal:  Negative for abdominal pain, anal bleeding and rectal pain.  Genitourinary:  Negative for dysuria (recently, but resolved today), genital sores, penile discharge, penile pain, scrotal swelling and testicular pain.  Musculoskeletal:  Negative for arthralgias and joint swelling.  Skin:  Negative for rash.  Neurological:  Negative for headaches.  Hematological:  Negative for adenopathy.    Sexual History:  Prefers male partners, oropharyngeal, rectal and penile sites exposed Condom use inconsistent   Past Medical History:  Diagnosis Date   Anal lesion 08/30/2020   Depression    Emotional lability 07/16/2014   Gynecomastia 07/16/2014   Hemorrhoid 08/30/2020   HIV disease (HCC)    Hypertension    IBD (inflammatory bowel disease)    Insomnia 08/29/2014   Need for prophylactic vaccination against Streptococcus pneumoniae (pneumococcus) 08/24/2017   Rectal pain    Recurrent major depression-severe (HCC) 07/16/2014    Outpatient  Medications Prior to Visit  Medication Sig Dispense Refill   Dolutegravir-lamiVUDine (DOVATO) 50-300 MG TABS Take 1 tablet by mouth daily. 30 tablet 11   lisinopril-hydrochlorothiazide (ZESTORETIC) 20-25 MG tablet Take 1 tablet by mouth daily. 90 tablet 1   sodium fluoride (SODIUM FLUORIDE 5000 PLUS) 1.1 % CREA dental cream Use peasize of this toothpaste and brush teeth at evening oral care. Spit out excess and do NOT rinse with water. 51 g 3   lidocaine-prilocaine (EMLA) cream Apply 1 application topically as needed. (Patient not taking: Reported on 11/19/2020) 30 g 0   valACYclovir (VALTREX) 500 MG tablet TAKE 1 TABLET(500 MG) BY MOUTH DAILY (Patient not taking: Reported on 11/19/2020) 90 tablet 3   No facility-administered medications prior to visit.    No Known Allergies  Family History  Problem Relation Age of Onset   Heart disease Mother         Objective:  Objective  Vitals:   11/19/20 0939  BP: 116/76  Pulse: 64  Temp: 98.2 F (36.8 C)  SpO2: 97%   Body mass index is 24.49 kg/m.   Physical Exam  Physical Exam Constitutional:      Appearance: Normal appearance. He is not ill-appearing.  HENT:     Head: Normocephalic.     Mouth/Throat:     Mouth: Mucous membranes are moist.     Pharynx: Oropharynx is clear.  Eyes:     General: No scleral icterus. Cardiovascular:     Rate and Rhythm: Normal rate.  Pulmonary:     Effort: Pulmonary effort  is normal.  Genitourinary:    Comments: Elected to self collect swabs and declined exam need given he has no symptoms Musculoskeletal:        General: Normal range of motion.     Cervical back: Normal range of motion.  Skin:    Coloration: Skin is not jaundiced or pale.  Neurological:     Mental Status: He is alert and oriented to person, place, and time.  Psychiatric:        Mood and Affect: Mood normal.        Judgment: Judgment normal.         Assessment & Plan:    Problem List Items Addressed This Visit        Unprioritized   HIV disease (HCC)    Back on oral ARVs with good tolerability - will update VL / CD4 today in preparation for his visit with Dr. Daiva Eves in a few weeks.  Someone at the health department tole him his viral load was too high recently @ 30. Discussed U=U concept with good viral suppression < 200 copies in the setting of daily ARV adherence and reassured him that his viral load of 30 was a great result. Encouraged him to discuss this again with Dr. Daiva Eves if he has any reservations.  Discussed safe sex counseling and prevention of other STIs.        History of syphilis    Will repeat RPR today to follow titer from 60m ago after treatment.  Previous syphilitic chancre to anus has resolved.       Dysuria - Primary    Dyusria and rectal pain recently that has since resolved. Will check extragenital GC/C testing and urine test today. Treatment as directed from test results.       Relevant Orders   Urine cytology ancillary only   Cytology (oral, anal, urethral) ancillary only   Cytology (oral, anal, urethral) ancillary only   Condoms provided today. Safe sex counseling discussed.   [name], MSN, NP-C [facility] Office: [phone] Pager: [phone]

## 2020-11-19 NOTE — Assessment & Plan Note (Signed)
Back on oral ARVs with good tolerability - will update VL / CD4 today in preparation for his visit with Dr. Daiva Eves in a few weeks.  Someone at the health department tole him his viral load was too high recently @ 30. Discussed U=U concept with good viral suppression < 200 copies in the setting of daily ARV adherence and reassured him that his viral load of 30 was a great result. Encouraged him to discuss this again with Dr. Daiva Eves if he has any reservations.  Discussed safe sex counseling and prevention of other STIs.

## 2020-11-19 NOTE — Assessment & Plan Note (Signed)
Dyusria and rectal pain recently that has since resolved. Will check extragenital GC/C testing and urine test today. Treatment as directed from test results.

## 2020-11-20 LAB — CYTOLOGY, (ORAL, ANAL, URETHRAL) ANCILLARY ONLY
Chlamydia: NEGATIVE
Chlamydia: NEGATIVE
Comment: NEGATIVE
Comment: NEGATIVE
Comment: NORMAL
Comment: NORMAL
Neisseria Gonorrhea: NEGATIVE
Neisseria Gonorrhea: NEGATIVE

## 2020-11-20 LAB — T-HELPER CELL (CD4) - (RCID CLINIC ONLY)
CD4 % Helper T Cell: 35 % (ref 33–65)
CD4 T Cell Abs: 800 /uL (ref 400–1790)

## 2020-11-20 LAB — URINE CYTOLOGY ANCILLARY ONLY
Chlamydia: NEGATIVE
Comment: NEGATIVE
Comment: NORMAL
Neisseria Gonorrhea: NEGATIVE

## 2020-12-03 ENCOUNTER — Ambulatory Visit: Payer: Self-pay | Admitting: Infectious Disease

## 2020-12-05 ENCOUNTER — Encounter: Payer: Self-pay | Admitting: Infectious Disease

## 2020-12-05 ENCOUNTER — Ambulatory Visit (INDEPENDENT_AMBULATORY_CARE_PROVIDER_SITE_OTHER): Payer: Self-pay | Admitting: Infectious Disease

## 2020-12-05 ENCOUNTER — Other Ambulatory Visit: Payer: Self-pay

## 2020-12-05 VITALS — BP 120/76 | HR 77 | Temp 98.5°F | Wt 175.0 lb

## 2020-12-05 DIAGNOSIS — F32A Depression, unspecified: Secondary | ICD-10-CM

## 2020-12-05 DIAGNOSIS — N1831 Chronic kidney disease, stage 3a: Secondary | ICD-10-CM

## 2020-12-05 DIAGNOSIS — F330 Major depressive disorder, recurrent, mild: Secondary | ICD-10-CM

## 2020-12-05 DIAGNOSIS — Z8619 Personal history of other infectious and parasitic diseases: Secondary | ICD-10-CM

## 2020-12-05 DIAGNOSIS — B20 Human immunodeficiency virus [HIV] disease: Secondary | ICD-10-CM

## 2020-12-05 DIAGNOSIS — I10 Essential (primary) hypertension: Secondary | ICD-10-CM

## 2020-12-05 DIAGNOSIS — Z7185 Encounter for immunization safety counseling: Secondary | ICD-10-CM

## 2020-12-05 HISTORY — DX: Depression, unspecified: F32.A

## 2020-12-05 NOTE — Progress Notes (Signed)
Subjective:  Chief complaint: Follow-up for HIV disease on medications   Patient ID: Jonathan Hendricks, male    DOB: [birth year], 60 y.o.   MRN: [id number]  HPI   60 year old Caucasian man living with HIV that has been well controlled on Triumeq who wished to switch to [place].  He did the oral lead-in and then was given loading dose and eventually switched over to every month dosing.  He was having injection site reactions particularly at the Rilpivirine site and these have not improved over time and had become quite unhappy about it.  He s he also told me that his depression had worsened and that he did not like having to go come some frequently for injections.  We therefore took him off of long-acting antiretrovirals and placed him on Dovato.  Since I last saw Jonathan Hendricks he came in with concerns for STIs with some rectal and penile discomfort.  His STI testing here in clinic was negative.  He says that he had been seen in the health department and they had drawn a viral load there that was 30 and that they had told him that he was "no longer undetectable and that he had to use protection with sex.  He has seen Jonathan Hendricks and Jonathan Hendricks updated him on what the actual recommendations are with regards to "U equals U and he is well below the threshold of transmission.  Typically a viral load of less than 200 in the partner studies showed no evidence of transmission whatsoever.  Most recent viral load with [place] was "not detected.  Did sustain a bruise to his right orbit when a tennis ball struck it while he was playing "pickle ball."       Past Medical History:  Diagnosis Date   Anal lesion 08/30/2020   Depression    Emotional lability 07/16/2014   Gynecomastia 07/16/2014   Hemorrhoid 08/30/2020   HIV disease (HCC)    Hypertension    IBD (inflammatory bowel disease)    Insomnia 08/29/2014   Need for prophylactic vaccination against Streptococcus pneumoniae (pneumococcus)  08/24/2017   Rectal pain    Recurrent major depression-severe (HCC) 07/16/2014    Past Surgical History:  Procedure Laterality Date   TONSILLECTOMY Bilateral     Family History  Problem Relation Age of Onset   Heart disease Mother       Social History   Socioeconomic History   Marital status: Single    Spouse name: Not on file   Number of children: Not on file   Years of education: Not on file   Highest education level: Not on file  Occupational History   Not on file  Tobacco Use   Smoking status: Never   Smokeless tobacco: Never  Substance and Sexual Activity   Alcohol use: Not Currently    Comment: OCCAS.   Drug use: No   Sexual activity: Not Currently    Partners: Male    Birth control/protection: Condom    Comment: patient declined condoms  Other Topics Concern   Not on file  Social History Narrative   Not on file   Social Determinants of Health   Financial Resource Strain: Not on file  Food Insecurity: Not on file  Transportation Needs: Not on file  Physical Activity: Not on file  Stress: Not on file  Social Connections: Not on file    No Known Allergies   Current Outpatient Medications:    Dolutegravir-lamiVUDine (DOVATO) 50-300 MG TABS, Take  1 tablet by mouth daily., Disp: 30 tablet, Rfl: 11   lisinopril-hydrochlorothiazide (ZESTORETIC) 20-25 MG tablet, Take 1 tablet by mouth daily., Disp: 90 tablet, Rfl: 1   sodium fluoride (SODIUM FLUORIDE 5000 PLUS) 1.1 % CREA dental cream, Use peasize of this toothpaste and brush teeth at evening oral care. Spit out excess and do NOT rinse with water., Disp: 51 g, Rfl: 3   lidocaine-prilocaine (EMLA) cream, Apply 1 application topically as needed. (Patient not taking: No sig reported), Disp: 30 g, Rfl: 0   valACYclovir (VALTREX) 500 MG tablet, TAKE 1 TABLET(500 MG) BY MOUTH DAILY (Patient not taking: No sig reported), Disp: 90 tablet, Rfl: 3   Review of Systems  Constitutional:  Positive for fatigue. Negative  for activity change, appetite change, chills, diaphoresis, fever and unexpected weight change.  HENT:  Negative for congestion, rhinorrhea, sinus pressure, sneezing, sore throat and trouble swallowing.   Eyes:  Negative for photophobia and visual disturbance.  Respiratory:  Negative for cough, chest tightness, shortness of breath, wheezing and stridor.   Cardiovascular:  Negative for chest pain, palpitations and leg swelling.  Gastrointestinal:  Negative for abdominal distention, abdominal pain, anal bleeding, blood in stool, constipation, diarrhea, nausea and vomiting.  Genitourinary:  Negative for difficulty urinating, dysuria, flank pain and hematuria.  Musculoskeletal:  Negative for arthralgias, back pain, gait problem, joint swelling and myalgias.  Skin:  Negative for color change, pallor, rash and wound.  Neurological:  Negative for dizziness, tremors, weakness and light-headedness.  Hematological:  Negative for adenopathy. Does not bruise/bleed easily.  Psychiatric/Behavioral:  Negative for agitation, behavioral problems, confusion, decreased concentration, dysphoric mood and sleep disturbance.   He has a rash on his feet that is symmetrical and is not responded to antifungal therapy       Objective:   Physical Exam Constitutional:      Appearance: He is well-developed.  HENT:     Head: Normocephalic and atraumatic.     Comments: He has some ecchymosis around the right orbit Eyes:     Conjunctiva/sclera: Conjunctivae normal.  Cardiovascular:     Rate and Rhythm: Normal rate and regular rhythm.  Pulmonary:     Effort: Pulmonary effort is normal. No respiratory distress.     Breath sounds: No wheezing.  Abdominal:     General: There is no distension.     Palpations: Abdomen is soft.  Musculoskeletal:        General: No tenderness. Normal range of motion.     Cervical back: Normal range of motion and neck supple.  Skin:    General: Skin is warm and dry.     Coloration: Skin  is not pale.     Findings: No erythema or rash.  Neurological:     General: No focal deficit present.     Mental Status: He is alert and oriented to person, place, and time.  Psychiatric:        Mood and Affect: Mood normal.        Behavior: Behavior normal.        Thought Content: Thought content normal.        Judgment: Judgment normal.       Assessment & Plan:   HIV disease:  I reviewed his labs including his viral load from September 27 that was not detected and his CD4 count from same date that was 800 with a percent CD4 of 35%  Syphilis: I have reviewed his syphilis titer from September 27 was 1-16 which is  a dramatic improvement from his titer in July which was 1-512 showing appropriate response to treatment.  STI screening most recent STI screening for gonorrhea chlamydia genital extragenital sites was negative  Hypertension: normotensive today  Bruising in right orbit from tennis bal: Improving  Depression: Also improved  CKD: Creatinine has been stable most recent creatinine from July is 1.48.   BMP Latest Ref Rng & Units 09/18/2020 08/30/2020 01/31/2020  Glucose 65 - 99 mg/dL 86 79 88  BUN 7 - 25 mg/dL 15 14 14   Creatinine 0.70 - 1.30 mg/dL ) 1.74(Y) 8.14(G)  BUN/Creat Ratio 6 - 22 (calc) 10 10 10   Sodium 135 - 146 mmol/L 140 139 136  Potassium 3.5 - 5.3 mmol/L 4.3 3.9 4.0  Chloride 98 - 110 mmol/L 102 101 99  CO2 20 - 32 mmol/L 31 28 28   Calcium 8.6 - 10.3 mg/dL 9.2 9.3 9.1     Vaccine counseling counseled him to get flu shot and also get the updated bivalent booster and he will do both but in 1-2 weeks as he is feeling fatigued today

## 2020-12-12 ENCOUNTER — Ambulatory Visit: Payer: Medicaid Other

## 2021-01-27 ENCOUNTER — Encounter: Payer: Medicaid Other | Admitting: Internal Medicine

## 2021-02-25 ENCOUNTER — Other Ambulatory Visit: Payer: Self-pay

## 2021-02-25 ENCOUNTER — Ambulatory Visit: Payer: Self-pay

## 2021-02-25 MED ORDER — VALACYCLOVIR HCL 500 MG PO TABS: ORAL_TABLET | ORAL | 1 refills | Status: DC

## 2021-03-03 ENCOUNTER — Other Ambulatory Visit: Payer: Self-pay

## 2021-03-24 LAB — HIV-1 RNA QUANT-NO REFLEX-BLD
HIV 1 RNA Quant: NOT DETECTED Copies/mL
HIV-1 RNA Quant, Log: NOT DETECTED Log cps/mL

## 2021-03-24 LAB — FLUORESCENT TREPONEMAL AB(FTA)-IGG-BLD: Fluorescent Treponemal ABS: REACTIVE — AB

## 2021-03-24 LAB — RPR TITER: RPR Titer: 1:16 {titer} — ABNORMAL HIGH

## 2021-03-24 LAB — RPR: RPR Ser Ql: REACTIVE — AB

## 2021-05-29 ENCOUNTER — Other Ambulatory Visit: Payer: Self-pay

## 2021-05-29 DIAGNOSIS — B20 Human immunodeficiency virus [HIV] disease: Secondary | ICD-10-CM

## 2021-05-30 LAB — T-HELPER CELL (CD4) - (RCID CLINIC ONLY)
CD4 % Helper T Cell: 37 % (ref 33–65)
CD4 T Cell Abs: 763 /uL (ref 400–1790)

## 2021-05-31 LAB — CBC WITH DIFFERENTIAL/PLATELET
Absolute Monocytes: 439 cells/uL (ref 200–950)
Basophils Absolute: 43 cells/uL (ref 0–200)
Basophils Relative: 0.6 %
Eosinophils Absolute: 79 cells/uL (ref 15–500)
Eosinophils Relative: 1.1 %
HCT: 41.7 % (ref 38.5–50.0)
Hemoglobin: 14.7 g/dL (ref 13.2–17.1)
Lymphs Abs: 2189 cells/uL (ref 850–3900)
MCH: 33.9 pg — ABNORMAL HIGH (ref 27.0–33.0)
MCHC: 35.3 g/dL (ref 32.0–36.0)
MCV: 96.3 fL (ref 80.0–100.0)
MPV: 10.3 fL (ref 7.5–12.5)
Monocytes Relative: 6.1 %
Neutro Abs: 4450 cells/uL (ref 1500–7800)
Neutrophils Relative %: 61.8 %
Platelets: 279 10*3/uL (ref 140–400)
RBC: 4.33 10*6/uL (ref 4.20–5.80)
RDW: 12.1 % (ref 11.0–15.0)
Total Lymphocyte: 30.4 %
WBC: 7.2 10*3/uL (ref 3.8–10.8)

## 2021-05-31 LAB — COMPLETE METABOLIC PANEL WITH GFR
AG Ratio: 1.2 (calc) (ref 1.0–2.5)
ALT: 8 U/L — ABNORMAL LOW (ref 9–46)
AST: 13 U/L (ref 10–35)
Albumin: 4.2 g/dL (ref 3.6–5.1)
Alkaline phosphatase (APISO): 77 U/L (ref 35–144)
BUN: 17 mg/dL (ref 7–25)
CO2: 27 mmol/L (ref 20–32)
Calcium: 9.3 mg/dL (ref 8.6–10.3)
Chloride: 102 mmol/L (ref 98–110)
Creat: 1.3 mg/dL (ref 0.70–1.35)
Globulin: 3.4 g/dL (calc) (ref 1.9–3.7)
Glucose, Bld: 128 mg/dL — ABNORMAL HIGH (ref 65–99)
Potassium: 3.9 mmol/L (ref 3.5–5.3)
Sodium: 138 mmol/L (ref 135–146)
Total Bilirubin: 0.8 mg/dL (ref 0.2–1.2)
Total Protein: 7.6 g/dL (ref 6.1–8.1)
eGFR: 63 mL/min/{1.73_m2} (ref >=60)

## 2021-05-31 LAB — RPR TITER: RPR Titer: 1:2 {titer} — ABNORMAL HIGH

## 2021-05-31 LAB — LIPID PANEL
Cholesterol: 147 mg/dL (ref <200)
HDL: 39 mg/dL — ABNORMAL LOW (ref >=40)
LDL Cholesterol (Calc): 88 mg/dL (calc)
Non-HDL Cholesterol (Calc): 108 mg/dL (calc) (ref <130)
Total CHOL/HDL Ratio: 3.8 (calc) (ref <5.0)
Triglycerides: 106 mg/dL (ref <150)

## 2021-05-31 LAB — HIV-1 RNA QUANT-NO REFLEX-BLD
HIV 1 RNA Quant: NOT DETECTED copies/mL
HIV-1 RNA Quant, Log: NOT DETECTED Log copies/mL

## 2021-05-31 LAB — RPR: RPR Ser Ql: REACTIVE — AB

## 2021-05-31 LAB — FLUORESCENT TREPONEMAL AB(FTA)-IGG-BLD: Fluorescent Treponemal ABS: REACTIVE — AB

## 2021-06-04 ENCOUNTER — Other Ambulatory Visit: Payer: Self-pay

## 2021-06-04 ENCOUNTER — Encounter: Payer: Self-pay | Admitting: Physician Assistant

## 2021-06-04 ENCOUNTER — Ambulatory Visit (INDEPENDENT_AMBULATORY_CARE_PROVIDER_SITE_OTHER): Payer: Self-pay | Admitting: Physician Assistant

## 2021-06-04 VITALS — BP 148/84 | HR 76 | Temp 98.4°F | Wt 173.0 lb

## 2021-06-04 DIAGNOSIS — B20 Human immunodeficiency virus [HIV] disease: Secondary | ICD-10-CM

## 2021-06-04 DIAGNOSIS — F332 Major depressive disorder, recurrent severe without psychotic features: Secondary | ICD-10-CM

## 2021-06-04 DIAGNOSIS — Z7721 Contact with and (suspected) exposure to potentially hazardous body fluids: Secondary | ICD-10-CM

## 2021-06-04 DIAGNOSIS — K6289 Other specified diseases of anus and rectum: Secondary | ICD-10-CM

## 2021-06-04 DIAGNOSIS — Z202 Contact with and (suspected) exposure to infections with a predominantly sexual mode of transmission: Secondary | ICD-10-CM

## 2021-06-04 MED ORDER — PENICILLIN G BENZATHINE 1200000 UNIT/2ML IM SUSY
1.2000 10*6.[IU] | PREFILLED_SYRINGE | Freq: Once | INTRAMUSCULAR | Status: AC
  Administered 2021-06-04: 1.2 10*6.[IU] via INTRAMUSCULAR

## 2021-06-04 MED ORDER — PENICILLIN G BENZATHINE 1200000 UNIT/2ML IM SUSY: 2.4000 10*6.[IU] | PREFILLED_SYRINGE | Freq: Once | INTRAMUSCULAR | Status: DC

## 2021-06-04 NOTE — Patient Instructions (Signed)
Great seeing you today ?Bicillin today ?I will call with results ?PSA today ?Follow up with Dr. Barbaraann Faster regarding depression ?Follow up in 6 months you can cancel appointment with Dr. Daiva Eves for next week ? ? ?

## 2021-06-04 NOTE — Addendum Note (Signed)
Addended by: Tressa Busman T on: 06/04/2021 04:48 PM ? ? Modules accepted: Orders ? ?
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
## 2021-06-05 ENCOUNTER — Telehealth: Payer: Self-pay

## 2021-06-05 LAB — URINE CYTOLOGY ANCILLARY ONLY
Chlamydia: NEGATIVE
Comment: NEGATIVE
Comment: NORMAL
Neisseria Gonorrhea: NEGATIVE

## 2021-06-05 LAB — CYTOLOGY, (ORAL, ANAL, URETHRAL) ANCILLARY ONLY
Chlamydia: NEGATIVE
Chlamydia: NEGATIVE
Comment: NEGATIVE
Comment: NEGATIVE
Comment: NORMAL
Comment: NORMAL
Neisseria Gonorrhea: NEGATIVE
Neisseria Gonorrhea: NEGATIVE

## 2021-06-05 NOTE — Telephone Encounter (Signed)
Patient called requesting results from recent STI testing. Communicated that tests that had already resulted were negative, but that syphilis test results were still pending. Patient stated understanding and that he would call back tomorrow. ? ?Wyvonne Lenz, RN  ?

## 2021-06-06 LAB — FLUORESCENT TREPONEMAL AB(FTA)-IGG-BLD: Fluorescent Treponemal ABS: REACTIVE — AB

## 2021-06-06 LAB — RPR TITER: RPR Titer: 1:1 {titer} — ABNORMAL HIGH

## 2021-06-06 LAB — PSA: PSA: 0.75 ng/mL (ref <=4.00)

## 2021-06-06 LAB — RPR: RPR Ser Ql: REACTIVE — AB

## 2021-06-09 ENCOUNTER — Telehealth: Payer: Self-pay | Admitting: Physician Assistant

## 2021-06-09 NOTE — Telephone Encounter (Signed)
Left message discussing recent lab results, all within normal range to and to call if he had any additional questions. ?

## 2021-06-12 ENCOUNTER — Encounter: Payer: Medicaid Other | Admitting: Infectious Disease

## 2021-08-22 ENCOUNTER — Telehealth: Payer: Self-pay

## 2021-08-22 NOTE — Telephone Encounter (Signed)
Called patient regarding refill request for Lisinopril. Patient does not have a PCP and has not attempted to establish care with a provider.  Encouraged patient to establish care with PCP to monitor his maintenance medication and follow up with anything not relate to his care with dr. Zenaida Niece dam. Patient would like to know if he could refill this while he works on getting new pcp. [facility].  Juanita Laster, RMA

## 2021-08-25 ENCOUNTER — Other Ambulatory Visit: Payer: Self-pay

## 2021-08-25 DIAGNOSIS — I10 Essential (primary) hypertension: Secondary | ICD-10-CM

## 2021-08-25 MED ORDER — LISINOPRIL-HYDROCHLOROTHIAZIDE 20-25 MG PO TABS: 1.0000 | ORAL_TABLET | Freq: Every day | ORAL | 0 refills | Status: DC

## 2021-09-03 ENCOUNTER — Other Ambulatory Visit: Payer: Self-pay

## 2021-09-03 DIAGNOSIS — B20 Human immunodeficiency virus [HIV] disease: Secondary | ICD-10-CM

## 2021-09-03 MED ORDER — DOVATO 50-300 MG PO TABS: 1.0000 | ORAL_TABLET | Freq: Every day | ORAL | 1 refills | Status: DC

## 2021-09-04 ENCOUNTER — Ambulatory Visit: Payer: Self-pay

## 2021-09-04 ENCOUNTER — Other Ambulatory Visit: Payer: Self-pay

## 2021-11-06 ENCOUNTER — Other Ambulatory Visit: Payer: Self-pay

## 2021-11-06 DIAGNOSIS — B20 Human immunodeficiency virus [HIV] disease: Secondary | ICD-10-CM

## 2021-11-06 MED ORDER — DOVATO 50-300 MG PO TABS: 1.0000 | ORAL_TABLET | Freq: Every day | ORAL | 5 refills | Status: DC

## 2022-01-07 ENCOUNTER — Other Ambulatory Visit: Payer: Self-pay

## 2022-01-07 DIAGNOSIS — Z113 Encounter for screening for infections with a predominantly sexual mode of transmission: Secondary | ICD-10-CM

## 2022-01-07 DIAGNOSIS — B20 Human immunodeficiency virus [HIV] disease: Secondary | ICD-10-CM

## 2022-01-13 ENCOUNTER — Other Ambulatory Visit: Payer: Self-pay

## 2022-01-13 ENCOUNTER — Other Ambulatory Visit (HOSPITAL_COMMUNITY)
Admission: RE | Admit: 2022-01-13 | Discharge: 2022-01-13 | Disposition: A | Discharge status: Home / Self Care | Payer: Medicaid Other | Source: Ambulatory Visit | Attending: Infectious Disease | Admitting: Infectious Disease

## 2022-01-13 DIAGNOSIS — B20 Human immunodeficiency virus [HIV] disease: Secondary | ICD-10-CM

## 2022-01-13 DIAGNOSIS — Z113 Encounter for screening for infections with a predominantly sexual mode of transmission: Secondary | ICD-10-CM

## 2022-01-13 LAB — T-HELPER CELL (CD4) - (RCID CLINIC ONLY)
CD4 % Helper T Cell: 37 % (ref 33–65)
CD4 T Cell Abs: 704 /uL (ref 400–1790)

## 2022-01-14 LAB — URINE CYTOLOGY ANCILLARY ONLY
Chlamydia: NEGATIVE
Comment: NEGATIVE
Comment: NORMAL
Neisseria Gonorrhea: NEGATIVE

## 2022-01-16 LAB — FLUORESCENT TREPONEMAL AB(FTA)-IGG-BLD: Fluorescent Treponemal ABS: REACTIVE — AB

## 2022-01-16 LAB — COMPLETE METABOLIC PANEL WITH GFR
AG Ratio: 1.3 (calc) (ref 1.0–2.5)
ALT: 10 U/L (ref 9–46)
AST: 15 U/L (ref 10–35)
Albumin: 4.4 g/dL (ref 3.6–5.1)
Alkaline phosphatase (APISO): 77 U/L (ref 35–144)
BUN/Creatinine Ratio: 11 (calc) (ref 6–22)
BUN: 16 mg/dL (ref 7–25)
CO2: 31 mmol/L (ref 20–32)
Calcium: 9.3 mg/dL (ref 8.6–10.3)
Chloride: 104 mmol/L (ref 98–110)
Creat: 1.4 mg/dL — ABNORMAL HIGH (ref 0.70–1.35)
Globulin: 3.4 g/dL (calc) (ref 1.9–3.7)
Glucose, Bld: 89 mg/dL (ref 65–99)
Potassium: 4.6 mmol/L (ref 3.5–5.3)
Sodium: 140 mmol/L (ref 135–146)
Total Bilirubin: 0.7 mg/dL (ref 0.2–1.2)
Total Protein: 7.8 g/dL (ref 6.1–8.1)
eGFR: 57 mL/min/{1.73_m2} — ABNORMAL LOW (ref >=60)

## 2022-01-16 LAB — CBC WITH DIFFERENTIAL/PLATELET
Absolute Monocytes: 348 cells/uL (ref 200–950)
Basophils Absolute: 29 cells/uL (ref 0–200)
Basophils Relative: 0.6 %
Eosinophils Absolute: 157 cells/uL (ref 15–500)
Eosinophils Relative: 3.2 %
HCT: 43.5 % (ref 38.5–50.0)
Hemoglobin: 15.2 g/dL (ref 13.2–17.1)
Lymphs Abs: 2029 cells/uL (ref 850–3900)
MCH: 33.2 pg — ABNORMAL HIGH (ref 27.0–33.0)
MCHC: 34.9 g/dL (ref 32.0–36.0)
MCV: 95 fL (ref 80.0–100.0)
MPV: 10.4 fL (ref 7.5–12.5)
Monocytes Relative: 7.1 %
Neutro Abs: 2337 cells/uL (ref 1500–7800)
Neutrophils Relative %: 47.7 %
Platelets: 303 10*3/uL (ref 140–400)
RBC: 4.58 10*6/uL (ref 4.20–5.80)
RDW: 12.9 % (ref 11.0–15.0)
Total Lymphocyte: 41.4 %
WBC: 4.9 10*3/uL (ref 3.8–10.8)

## 2022-01-16 LAB — HIV-1 RNA QUANT-NO REFLEX-BLD
HIV 1 RNA Quant: NOT DETECTED Copies/mL
HIV-1 RNA Quant, Log: NOT DETECTED Log cps/mL

## 2022-01-16 LAB — RPR: RPR Ser Ql: REACTIVE — AB

## 2022-01-16 LAB — RPR TITER: RPR Titer: 1:2 {titer} — ABNORMAL HIGH

## 2022-01-23 DIAGNOSIS — Z419 Encounter for procedure for purposes other than remedying health state, unspecified: Secondary | ICD-10-CM | POA: Diagnosis not present

## 2022-01-26 ENCOUNTER — Encounter: Payer: Self-pay | Admitting: Infectious Disease

## 2022-01-26 DIAGNOSIS — E785 Hyperlipidemia, unspecified: Secondary | ICD-10-CM | POA: Insufficient documentation

## 2022-01-26 HISTORY — DX: Hyperlipidemia, unspecified: E78.5

## 2022-01-26 NOTE — Progress Notes (Unsigned)
Subjective:  Chief complaint:  follow-up for HIV disease   Patient ID: Jonathan Hendricks, male    DOB: 04-12-1960, 61 y.o.   MRN: [id number]  HPI   61 year old Caucasian man living with HIV that has been well controlled on Triumeq who wished to switch to [place] and now to [facility].  He has comorbid depression and problems with emotional lability.  He did not recall the visit with Tresa Endo in April but he sounded quite down based on Kelly's note.  He is living in house of his former partner.  He tells me he doesn't see the point in "talking" and I did tell him that there is a role for not just talking but examining thought patterns, behaviors, that can be changed to improve depression. He did not want to be on medications for this.   He did not want to see Okey Regal.  I recommended ADS services.        Past Medical History:  Diagnosis Date   Anal lesion 08/30/2020   Depression    Depression 12/05/2020   Emotional lability 07/16/2014   Gynecomastia 07/16/2014   Hemorrhoid 08/30/2020   HIV disease (HCC)    Hypertension    IBD (inflammatory bowel disease)    Insomnia 08/29/2014   Need for prophylactic vaccination against Streptococcus pneumoniae (pneumococcus) 08/24/2017   Rectal pain    Recurrent major depression-severe (HCC) 07/16/2014    Past Surgical History:  Procedure Laterality Date   TONSILLECTOMY Bilateral     Family History  Problem Relation Age of Onset   Heart disease Mother       Social History   Socioeconomic History   Marital status: Single    Spouse name: Not on file   Number of children: Not on file   Years of education: Not on file   Highest education level: Not on file  Occupational History   Not on file  Tobacco Use   Smoking status: Never   Smokeless tobacco: Never  Substance and Sexual Activity   Alcohol use: Not Currently    Comment: OCCAS.   Drug use: No    Comment: declined condoms   Sexual activity: Not Currently    Partners:  Male    Birth control/protection: Condom    Comment: patient declined condoms  Other Topics Concern   Not on file  Social History Narrative   Not on file   Social Determinants of Health   Financial Resource Strain: Not on file  Food Insecurity: Not on file  Transportation Needs: Not on file  Physical Activity: Not on file  Stress: Not on file  Social Connections: Not on file    No Known Allergies   Current Outpatient Medications:    dolutegravir-lamiVUDine (DOVATO) 50-300 MG tablet, Take 1 tablet by mouth daily., Disp: 30 tablet, Rfl: 5   lidocaine-prilocaine (EMLA) cream, Apply 1 application topically as needed., Disp: 30 g, Rfl: 0   lisinopril-hydrochlorothiazide (ZESTORETIC) 20-25 MG tablet, Take 1 tablet by mouth daily., Disp: 90 tablet, Rfl: 0   sodium fluoride (SODIUM FLUORIDE 5000 PLUS) 1.1 % CREA dental cream, Use peasize of this toothpaste and brush teeth at evening oral care. Spit out excess and do NOT rinse with water., Disp: 51 g, Rfl: 3   valACYclovir (VALTREX) 500 MG tablet, TAKE 1 TABLET(500 MG) BY MOUTH DAILY, Disp: 90 tablet, Rfl: 1   Review of Systems  Constitutional:  Negative for activity change, appetite change, chills, diaphoresis, fatigue, fever and unexpected weight change.  HENT:  Negative for congestion, rhinorrhea, sinus pressure, sneezing, sore throat and trouble swallowing.   Eyes:  Negative for photophobia and visual disturbance.  Respiratory:  Negative for cough, chest tightness, shortness of breath, wheezing and stridor.   Cardiovascular:  Negative for chest pain, palpitations and leg swelling.  Gastrointestinal:  Negative for abdominal distention, abdominal pain, anal bleeding, blood in stool, constipation, diarrhea, nausea and vomiting.  Genitourinary:  Negative for difficulty urinating, dysuria, flank pain and hematuria.  Musculoskeletal:  Negative for arthralgias, back pain, gait problem, joint swelling and myalgias.  Skin:  Negative for color  change, pallor, rash and wound.  Neurological:  Negative for dizziness, tremors, weakness and light-headedness.  Hematological:  Negative for adenopathy. Does not bruise/bleed easily.  Psychiatric/Behavioral:  Positive for dysphoric mood. Negative for agitation, behavioral problems, confusion, decreased concentration, self-injury and sleep disturbance.          Objective:   Physical Exam Constitutional:      Appearance: He is well-developed.  HENT:     Head: Normocephalic and atraumatic.  Eyes:     Conjunctiva/sclera: Conjunctivae normal.  Cardiovascular:     Rate and Rhythm: Normal rate and regular rhythm.  Pulmonary:     Effort: Pulmonary effort is normal. No respiratory distress.     Breath sounds: No wheezing.  Abdominal:     General: There is no distension.     Palpations: Abdomen is soft.  Musculoskeletal:        General: No tenderness. Normal range of motion.     Cervical back: Normal range of motion and neck supple.  Skin:    General: Skin is warm and dry.     Coloration: Skin is not pale.     Findings: No erythema or rash.  Neurological:     General: No focal deficit present.     Mental Status: He is alert and oriented to person, place, and time.  Psychiatric:        Mood and Affect: Mood normal.        Behavior: Behavior normal.        Thought Content: Thought content normal.        Judgment: Judgment normal.        Assessment & Plan:   HIV disease:  I have reviewed Jonathan Hendricks's labs including viral load which was  Lab Results  Component Value Date   HIV1RNAQUANT Not Detected 01/13/2022   and cd4 which was  Lab Results  Component Value Date   CD4TABS 704 01/13/2022     I am continuing patient's prescription for  Dovato  Hypertension: normotensive today  Hyperlipidemia: based on REPRIEVE trial will initiate lipitor  CKD: Creatinine has been stable      Latest Ref Rng & Units 01/13/2022    8:33 AM 05/29/2021    9:09 AM 09/18/2020    2:48  PM  BMP  Glucose 65 - 99 mg/dL 89  128  86   BUN 7 - 25 mg/dL 16  17  15    Creatinine 0.70 - 1.35 mg/dL 1.40  1.30  1.48   BUN/Creat Ratio 6 - 22 (calc) 11  NOT APPLICABLE  10   Sodium 135 - 146 mmol/L 140  138  140   Potassium 3.5 - 5.3 mmol/L 4.6  3.9  4.3   Chloride 98 - 110 mmol/L 104  102  102   CO2 20 - 32 mmol/L 31  27  31    Calcium 8.6 - 10.3 mg/dL 9.3  9.3  9.2     Depression: this is an ongoing issue for him. He really needs effective CBT or something to change his way of seeing things.  Vaccine counseling: recommended updated COVID 19 vaccine.

## 2022-01-28 ENCOUNTER — Ambulatory Visit (INDEPENDENT_AMBULATORY_CARE_PROVIDER_SITE_OTHER): Payer: Medicaid Other | Admitting: Infectious Disease

## 2022-01-28 ENCOUNTER — Other Ambulatory Visit: Payer: Self-pay

## 2022-01-28 ENCOUNTER — Encounter: Payer: Self-pay | Admitting: Infectious Disease

## 2022-01-28 VITALS — BP 135/83 | HR 81 | Temp 98.2°F | Ht 72.0 in | Wt 181.0 lb

## 2022-01-28 DIAGNOSIS — B009 Herpesviral infection, unspecified: Secondary | ICD-10-CM | POA: Diagnosis not present

## 2022-01-28 DIAGNOSIS — B20 Human immunodeficiency virus [HIV] disease: Secondary | ICD-10-CM

## 2022-01-28 DIAGNOSIS — F331 Major depressive disorder, recurrent, moderate: Secondary | ICD-10-CM

## 2022-01-28 DIAGNOSIS — E785 Hyperlipidemia, unspecified: Secondary | ICD-10-CM | POA: Diagnosis not present

## 2022-01-28 DIAGNOSIS — I10 Essential (primary) hypertension: Secondary | ICD-10-CM

## 2022-01-28 DIAGNOSIS — Z7185 Encounter for immunization safety counseling: Secondary | ICD-10-CM | POA: Diagnosis not present

## 2022-01-28 DIAGNOSIS — N1831 Chronic kidney disease, stage 3a: Secondary | ICD-10-CM | POA: Diagnosis not present

## 2022-01-28 HISTORY — DX: Encounter for immunization safety counseling: Z71.85

## 2022-01-28 MED ORDER — DOVATO 50-300 MG PO TABS: 1.0000 | ORAL_TABLET | Freq: Every day | ORAL | 11 refills | Status: DC

## 2022-01-28 MED ORDER — ATORVASTATIN CALCIUM 20 MG PO TABS: 20.0000 mg | ORAL_TABLET | Freq: Every day | ORAL | 11 refills | Status: DC

## 2022-02-23 DIAGNOSIS — Z419 Encounter for procedure for purposes other than remedying health state, unspecified: Secondary | ICD-10-CM | POA: Diagnosis not present

## 2022-02-26 ENCOUNTER — Ambulatory Visit: Payer: Medicaid Other
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
## 2022-03-11 ENCOUNTER — Encounter: Payer: Medicaid Other | Admitting: Student

## 2022-03-25 ENCOUNTER — Other Ambulatory Visit: Payer: Self-pay

## 2022-03-25 MED ORDER — VALACYCLOVIR HCL 500 MG PO TABS: ORAL_TABLET | ORAL | 1 refills | Status: DC

## 2022-03-26 DIAGNOSIS — Z419 Encounter for procedure for purposes other than remedying health state, unspecified: Secondary | ICD-10-CM | POA: Diagnosis not present

## 2022-04-24 DIAGNOSIS — Z419 Encounter for procedure for purposes other than remedying health state, unspecified: Secondary | ICD-10-CM | POA: Diagnosis not present
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
## 2022-05-08 ENCOUNTER — Ambulatory Visit (INDEPENDENT_AMBULATORY_CARE_PROVIDER_SITE_OTHER): Payer: Medicaid Other | Admitting: Student

## 2022-05-08 ENCOUNTER — Encounter: Payer: Self-pay | Admitting: Student

## 2022-05-08 VITALS — BP 112/62 | HR 81 | Ht 72.0 in | Wt 184.8 lb

## 2022-05-08 DIAGNOSIS — T1490XD Injury, unspecified, subsequent encounter: Secondary | ICD-10-CM

## 2022-05-08 DIAGNOSIS — F332 Major depressive disorder, recurrent severe without psychotic features: Secondary | ICD-10-CM

## 2022-05-08 DIAGNOSIS — I10 Essential (primary) hypertension: Secondary | ICD-10-CM | POA: Diagnosis not present

## 2022-05-08 DIAGNOSIS — Z1211 Encounter for screening for malignant neoplasm of colon: Secondary | ICD-10-CM

## 2022-05-08 DIAGNOSIS — L578 Other skin changes due to chronic exposure to nonionizing radiation: Secondary | ICD-10-CM

## 2022-05-08 DIAGNOSIS — Z7689 Persons encountering health services in other specified circumstances: Secondary | ICD-10-CM | POA: Insufficient documentation

## 2022-05-08 DIAGNOSIS — N529 Male erectile dysfunction, unspecified: Secondary | ICD-10-CM

## 2022-05-08 MED ORDER — SILDENAFIL CITRATE 50 MG PO TABS: 50.0000 mg | ORAL_TABLET | Freq: Every day | ORAL | 1 refills | Status: DC | PRN

## 2022-05-08 NOTE — Assessment & Plan Note (Addendum)
PHQ-9 score of 16 with score of 2 for question 9.  He states history of bipolar diagnosis but never treatment.  His history is concerning for emotional lability which he admits to getting triggered easily by other people which is best managed by strictly playing pickle ball in the mornings and limiting time around others.  Does not have well thought out plan however I do have concerns that this is poorly managed and have discussed with him that I advise follow-up with psychiatry to which she is amenable.  Suicide hotline resources provided as well.

## 2022-05-08 NOTE — Patient Instructions (Addendum)
It was great to see you today! Thank you for choosing [facility] for your primary care. Jonathan Hendricks was seen for establish care.  Today we addressed: Please return the colon cancer screening test when you are able.  We should discuss skin cancer prophylactic treatment in the late fall or winter time of this year as you have significant sun damage. For the spot on your left buttocks I have provided you some over-the-counter antibiotic.  I would also continue the Epsom salts. I provided you with some suicidal ideation resources below please call these if you are in need.  I have also placed a psychiatry referral, you will be contacted at some point for setting up an appointment.  Should you not hear anything in the next 2 to 3 weeks, please let us know. I have sent a Viagra prescription to your pharmacy.  If you haven't already, sign up for My Chart to have easy access to your labs results, and communication with your primary care physician.  Call the clinic at [phone] if your symptoms worsen or you have any concerns.  You should return to our clinic No follow-ups on file. Please arrive 15 minutes before your appointment to ensure smooth check in process.  We appreciate your efforts in making this happen.  Thank you for allowing me to participate in your care, [name], DO 05/08/2022, 2:22 PM PGY-2, [place]   If you are feeling suicidal or depression symptoms worsen please immediately go to:   If you are thinking about harming yourself or having thoughts of suicide, or if you know someone who is, seek help right away. If you are in crisis, make sure you are not left alone.  If someone else is in crisis, make sure he/she/they is not left alone  Call 988 OR 1-800-273-TALK  24 Hour Availability for [place]  [address] [place], [place] [facility] [phone]    Other crisis  resources:  [facility] [employer] (Domestic Violence, Rape & Victim Assistance [phone]  [facility] [place]    (ONLY from 8am-4pm)    [phone]  [facility] (24/7)   [phone]  [place] National Suicide Hotline   [phone] Diamantina Monks)

## 2022-05-08 NOTE — Assessment & Plan Note (Signed)
Well-controlled on Zestoretic 20-25 mg daily.

## 2022-05-08 NOTE — Assessment & Plan Note (Signed)
Well-managed thus far by infectious disease.  He is caught up regarding his vaccines and regular screening with the exception of colonoscopy.  Amenable to FIT testing for colon cancer screening.

## 2022-05-08 NOTE — Assessment & Plan Note (Signed)
Greatly present on face/head/neck.  Would be a great candidate for imiquimod cream in the late fall/winter.  I have advised him to return during that time so we may discuss.  Should he have acute skin needs he may be scheduled in our skin clinic here at [facility].

## 2022-05-08 NOTE — Assessment & Plan Note (Signed)
On left buttock, provided OTC bacitracin and continue Epsom salts.  All signs point to appropriate healing and no signs of infection.

## 2022-05-19 DIAGNOSIS — Z1211 Encounter for screening for malignant neoplasm of colon: Secondary | ICD-10-CM | POA: Diagnosis not present

## 2022-05-20 LAB — FECAL OCCULT BLOOD, IMMUNOCHEMICAL: Fecal Occult Bld: NEGATIVE

## 2022-05-25 DIAGNOSIS — Z419 Encounter for procedure for purposes other than remedying health state, unspecified: Secondary | ICD-10-CM | POA: Diagnosis not present

## 2022-06-24 DIAGNOSIS — Z419 Encounter for procedure for purposes other than remedying health state, unspecified: Secondary | ICD-10-CM | POA: Diagnosis not present

## 2022-07-25 DIAGNOSIS — Z419 Encounter for procedure for purposes other than remedying health state, unspecified: Secondary | ICD-10-CM | POA: Diagnosis not present

## 2022-08-24 DIAGNOSIS — Z419 Encounter for procedure for purposes other than remedying health state, unspecified: Secondary | ICD-10-CM | POA: Diagnosis not present

## 2022-09-24 ENCOUNTER — Ambulatory Visit (INDEPENDENT_AMBULATORY_CARE_PROVIDER_SITE_OTHER): Payer: Medicaid Other | Admitting: Family Medicine

## 2022-09-24 ENCOUNTER — Other Ambulatory Visit: Payer: Self-pay

## 2022-09-24 VITALS — BP 114/83 | HR 78 | Ht 72.0 in | Wt 178.0 lb

## 2022-09-24 DIAGNOSIS — Z419 Encounter for procedure for purposes other than remedying health state, unspecified: Secondary | ICD-10-CM | POA: Diagnosis not present

## 2022-09-24 DIAGNOSIS — R21 Rash and other nonspecific skin eruption: Secondary | ICD-10-CM

## 2022-09-24 LAB — POCT ORAL KOH: KOH Prep POC: NEGATIVE

## 2022-09-24 MED ORDER — HYDROCORTISONE 1 % EX OINT: 1.0000 | TOPICAL_OINTMENT | Freq: Two times a day (BID) | CUTANEOUS | 0 refills | Status: DC

## 2022-09-24 NOTE — Patient Instructions (Signed)
I will give you a call if any results are abnormal  Please follow up with infectious disease  You can try hydrocortisone ointment - I have sent this to your pharmacy  Follow up in about 2-4 weeks
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
## 2022-09-30 ENCOUNTER — Other Ambulatory Visit: Payer: Self-pay | Admitting: Family Medicine

## 2022-09-30 DIAGNOSIS — R21 Rash and other nonspecific skin eruption: Secondary | ICD-10-CM

## 2022-09-30 MED ORDER — HYDROCORTISONE 1 % EX OINT: 1.0000 | TOPICAL_OINTMENT | Freq: Two times a day (BID) | CUTANEOUS | 0 refills | Status: AC

## 2022-09-30 NOTE — Progress Notes (Signed)
Called patient to discuss results.  Creatinine and GFR slightly worse compared to baseline although technically not an AKI.  Scheduled patient for lab visit tomorrow 8/8 at 4:15 PM to check repeat BMP as well as hepatic function panel, hepatitis panel, RPR, and urine G/C.  States that his rash has remained about the same although he was unable to pick up the hydrocortisone ointment from his pharmacy because it seems they never received the prescription.  I have resent this to his pharmacy.  He has follow-up scheduled with ID later this month.

## 2022-10-01 ENCOUNTER — Encounter: Payer: Self-pay | Admitting: Family Medicine

## 2022-10-01 ENCOUNTER — Other Ambulatory Visit: Payer: Self-pay

## 2022-10-01 DIAGNOSIS — R21 Rash and other nonspecific skin eruption: Secondary | ICD-10-CM

## 2022-10-05 ENCOUNTER — Other Ambulatory Visit (HOSPITAL_COMMUNITY)
Admission: RE | Admit: 2022-10-05 | Discharge: 2022-10-05 | Disposition: A | Discharge status: Home / Self Care | Payer: Medicaid Other | Source: Ambulatory Visit | Attending: Family Medicine | Admitting: Family Medicine

## 2022-10-05 ENCOUNTER — Other Ambulatory Visit: Payer: Medicaid Other

## 2022-10-05 DIAGNOSIS — R21 Rash and other nonspecific skin eruption: Secondary | ICD-10-CM

## 2022-10-07 ENCOUNTER — Other Ambulatory Visit: Payer: Self-pay

## 2022-10-07 ENCOUNTER — Other Ambulatory Visit: Payer: Medicaid Other

## 2022-10-07 DIAGNOSIS — B20 Human immunodeficiency virus [HIV] disease: Secondary | ICD-10-CM | POA: Diagnosis not present

## 2022-10-08 LAB — HEPATITIS C ANTIBODY: Hepatitis C Ab: NONREACTIVE

## 2022-10-21 ENCOUNTER — Encounter: Payer: Self-pay | Admitting: Infectious Disease

## 2022-10-21 ENCOUNTER — Ambulatory Visit: Payer: Medicaid Other | Admitting: Infectious Disease

## 2022-10-21 ENCOUNTER — Other Ambulatory Visit: Payer: Self-pay

## 2022-10-21 VITALS — BP 138/82 | HR 80 | Temp 97.8°F | Wt 165.0 lb

## 2022-10-21 DIAGNOSIS — R4586 Emotional lability: Secondary | ICD-10-CM

## 2022-10-21 DIAGNOSIS — Z7185 Encounter for immunization safety counseling: Secondary | ICD-10-CM | POA: Diagnosis not present

## 2022-10-21 DIAGNOSIS — B20 Human immunodeficiency virus [HIV] disease: Secondary | ICD-10-CM | POA: Diagnosis not present

## 2022-10-21 DIAGNOSIS — I10 Essential (primary) hypertension: Secondary | ICD-10-CM

## 2022-10-21 DIAGNOSIS — F321 Major depressive disorder, single episode, moderate: Secondary | ICD-10-CM

## 2022-10-21 DIAGNOSIS — E785 Hyperlipidemia, unspecified: Secondary | ICD-10-CM

## 2022-10-21 DIAGNOSIS — R21 Rash and other nonspecific skin eruption: Secondary | ICD-10-CM

## 2022-10-21 MED ORDER — DOVATO 50-300 MG PO TABS
1.0000 | ORAL_TABLET | Freq: Every day | ORAL | 11 refills | Status: DC
Start: 2022-10-21 — End: 2023-05-17

## 2022-10-21 MED ORDER — ATORVASTATIN CALCIUM 20 MG PO TABS: 20.0000 mg | ORAL_TABLET | Freq: Every day | ORAL | 11 refills | Status: DC
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
## 2022-10-25 DIAGNOSIS — Z419 Encounter for procedure for purposes other than remedying health state, unspecified: Secondary | ICD-10-CM | POA: Diagnosis not present

## 2022-10-25 LAB — T-HELPER CELLS (CD4) COUNT (NOT AT ARMC)
Absolute CD4: 1122 {cells}/uL (ref 490–1740)
CD4 T Helper %: 33 % (ref 30–61)
Total lymphocyte count: 3387 {cells}/uL (ref 850–3900)

## 2022-10-25 LAB — HIV RNA, RTPCR W/R GT (RTI, PI,INT)
HIV 1 RNA Quant: NOT DETECTED {copies}/mL
HIV-1 RNA Quant, Log: NOT DETECTED {Log_copies}/mL

## 2022-11-24 DIAGNOSIS — Z419 Encounter for procedure for purposes other than remedying health state, unspecified: Secondary | ICD-10-CM | POA: Diagnosis not present

## 2022-12-25 DIAGNOSIS — Z419 Encounter for procedure for purposes other than remedying health state, unspecified: Secondary | ICD-10-CM | POA: Diagnosis not present

## 2023-01-14 ENCOUNTER — Other Ambulatory Visit: Payer: Self-pay

## 2023-01-14 DIAGNOSIS — I10 Essential (primary) hypertension: Secondary | ICD-10-CM

## 2023-01-14 MED ORDER — VALACYCLOVIR HCL 500 MG PO TABS: ORAL_TABLET | ORAL | 1 refills | Status: DC

## 2023-01-14 MED ORDER — LISINOPRIL-HYDROCHLOROTHIAZIDE 20-25 MG PO TABS: 1.0000 | ORAL_TABLET | Freq: Every day | ORAL | 0 refills | Status: DC

## 2023-01-24 DIAGNOSIS — Z419 Encounter for procedure for purposes other than remedying health state, unspecified: Secondary | ICD-10-CM | POA: Diagnosis not present

## 2023-02-13 ENCOUNTER — Other Ambulatory Visit: Payer: Self-pay | Admitting: Student

## 2023-02-13 DIAGNOSIS — N529 Male erectile dysfunction, unspecified: Secondary | ICD-10-CM

## 2023-02-24 DIAGNOSIS — Z419 Encounter for procedure for purposes other than remedying health state, unspecified: Secondary | ICD-10-CM | POA: Diagnosis not present

## 2023-03-08 ENCOUNTER — Telehealth: Payer: Self-pay

## 2023-03-08 NOTE — Telephone Encounter (Signed)
 Patient called with concerns for STI exposure.   Reports he had condomless insertive anal sex on New Years. Reports there was blood from his partner on his penis.   He has concerns about gonorrhea, chlamydia, and syphilis, but is also specifically worried about hep b, hep c, and HPV.   He is requesting an appointment 3 weeks out from his exposure date for testing. Scheduled for 1/22. Patient aware to refrain from any sex until testing can be done.  He is interested in HPV vaccine and would like to discuss this and anal pap with Dr. Fleeta Rothman at his appointment.   Khiara Shuping D Emerly Prak, RN

## 2023-03-17 ENCOUNTER — Ambulatory Visit: Payer: Medicaid Other | Admitting: Infectious Disease

## 2023-03-17 ENCOUNTER — Encounter: Payer: Self-pay | Admitting: Infectious Disease

## 2023-03-17 ENCOUNTER — Other Ambulatory Visit (HOSPITAL_COMMUNITY)
Admission: RE | Admit: 2023-03-17 | Discharge: 2023-03-17 | Disposition: A | Discharge status: Home / Self Care | Payer: Medicaid Other | Source: Ambulatory Visit | Attending: Infectious Disease | Admitting: Infectious Disease

## 2023-03-17 ENCOUNTER — Other Ambulatory Visit: Payer: Self-pay

## 2023-03-17 VITALS — BP 131/82 | HR 97 | Temp 98.2°F | Wt 178.0 lb

## 2023-03-17 DIAGNOSIS — Z113 Encounter for screening for infections with a predominantly sexual mode of transmission: Secondary | ICD-10-CM

## 2023-03-17 DIAGNOSIS — I10 Essential (primary) hypertension: Secondary | ICD-10-CM

## 2023-03-17 DIAGNOSIS — Z7185 Encounter for immunization safety counseling: Secondary | ICD-10-CM

## 2023-03-17 DIAGNOSIS — E785 Hyperlipidemia, unspecified: Secondary | ICD-10-CM

## 2023-03-17 DIAGNOSIS — B20 Human immunodeficiency virus [HIV] disease: Secondary | ICD-10-CM

## 2023-03-17 DIAGNOSIS — Z202 Contact with and (suspected) exposure to infections with a predominantly sexual mode of transmission: Secondary | ICD-10-CM

## 2023-03-17 HISTORY — DX: Encounter for screening for infections with a predominantly sexual mode of transmission: Z11.3

## 2023-03-17 NOTE — Addendum Note (Signed)
Addended by: Harley Alto on: 03/17/2023 03:35 PM   Modules accepted: Orders
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
## 2023-03-18 LAB — CYTOLOGY, (ORAL, ANAL, URETHRAL) ANCILLARY ONLY
Chlamydia: NEGATIVE
Chlamydia: NEGATIVE
Comment: NEGATIVE
Comment: NEGATIVE
Comment: NORMAL
Comment: NORMAL
Neisseria Gonorrhea: NEGATIVE
Neisseria Gonorrhea: NEGATIVE

## 2023-03-19 ENCOUNTER — Telehealth: Payer: Self-pay

## 2023-03-19 LAB — URINE CYTOLOGY ANCILLARY ONLY
Chlamydia: NEGATIVE
Comment: NEGATIVE
Comment: NEGATIVE
Comment: NORMAL
Neisseria Gonorrhea: NEGATIVE
Trichomonas: NEGATIVE

## 2023-03-19 NOTE — Telephone Encounter (Signed)
-----   Message from [facility] sent at 03/18/2023  2:17 PM EST ----- Regarding: FW: So far STis negative ----- Message ----- From: Janace Hoard Lab Results In Sent: 03/18/2023  11:24 AM EST To: Randall Hiss, MD

## 2023-03-19 NOTE — Telephone Encounter (Signed)
Spoke with Maisie Fus, discussed that so far all STI testing is negative. Relayed that we're still waiting on urine test for gonorrhea and chlamydia and anal pap results.   Spent time discussing purpose of anal pap. Patient verbalized understanding and has no further questions.   Sandie Ano, RN

## 2023-03-22 LAB — HIV-1 RNA QUANT-NO REFLEX-BLD
HIV 1 RNA Quant: NOT DETECTED {copies}/mL
HIV-1 RNA Quant, Log: NOT DETECTED {Log}

## 2023-03-22 LAB — HEPATITIS C AB W/RFL RNA, PCR + GENO: Hepatitis C Ab: NONREACTIVE

## 2023-03-22 LAB — RPR: RPR Ser Ql: NONREACTIVE

## 2023-03-22 LAB — HEPATITIS A ANTIBODY, TOTAL: Hepatitis A AB,Total: NONREACTIVE

## 2023-03-22 LAB — HEPATITIS B SURFACE ANTIBODY, QUANTITATIVE: Hep B S AB Quant (Post): 139 m[IU]/mL (ref >=10)

## 2023-03-22 LAB — HEPATITIS B SURFACE ANTIGEN: Hepatitis B Surface Ag: NONREACTIVE

## 2023-03-23 ENCOUNTER — Telehealth: Payer: Self-pay

## 2023-03-23 LAB — CYTOLOGY - PAP
Adequacy: ABSENT
Diagnosis: NEGATIVE

## 2023-03-23 NOTE — Telephone Encounter (Signed)
Called Erica to relay results, no answer. Left HIPAA compliant voicemail requesting callback.   Sandie Ano, RN

## 2023-03-23 NOTE — Telephone Encounter (Signed)
-----   Message from [facility] sent at 03/23/2023 11:50 AM EST ----- Negative anal pap and negative STI workup. So good news all round ----- Message ----- From: [facility], [facility] Lab Results In Sent: 03/18/2023  11:24 AM EST To: Randall Hiss, MD

## 2023-03-23 NOTE — Telephone Encounter (Signed)
Patient returned call, notified him that anal pap and STI testing were all negative for abnormalities. Patient verbalized understanding and has no further questions.   Sandie Ano, RN

## 2023-03-24 ENCOUNTER — Ambulatory Visit: Payer: Medicaid Other

## 2023-03-24 ENCOUNTER — Other Ambulatory Visit: Payer: Self-pay

## 2023-03-24 DIAGNOSIS — Z23 Encounter for immunization: Secondary | ICD-10-CM | POA: Diagnosis not present

## 2023-03-27 DIAGNOSIS — Z419 Encounter for procedure for purposes other than remedying health state, unspecified: Secondary | ICD-10-CM | POA: Diagnosis not present

## 2023-04-16 ENCOUNTER — Other Ambulatory Visit: Payer: Self-pay

## 2023-04-23 ENCOUNTER — Other Ambulatory Visit: Payer: Self-pay | Admitting: Infectious Disease

## 2023-04-23 DIAGNOSIS — I10 Essential (primary) hypertension: Secondary | ICD-10-CM

## 2023-04-24 DIAGNOSIS — Z419 Encounter for procedure for purposes other than remedying health state, unspecified: Secondary | ICD-10-CM | POA: Diagnosis not present

## 2023-05-03 ENCOUNTER — Other Ambulatory Visit: Payer: Medicaid Other

## 2023-05-03 ENCOUNTER — Other Ambulatory Visit (HOSPITAL_COMMUNITY)
Admission: RE | Admit: 2023-05-03 | Discharge: 2023-05-03 | Disposition: A | Discharge status: Home / Self Care | Source: Ambulatory Visit | Attending: Infectious Disease | Admitting: Infectious Disease

## 2023-05-03 ENCOUNTER — Other Ambulatory Visit: Payer: Self-pay

## 2023-05-03 DIAGNOSIS — B20 Human immunodeficiency virus [HIV] disease: Secondary | ICD-10-CM

## 2023-05-04 LAB — URINE CYTOLOGY ANCILLARY ONLY
Chlamydia: NEGATIVE
Comment: NEGATIVE
Comment: NORMAL
Neisseria Gonorrhea: NEGATIVE

## 2023-05-07 LAB — HIV-1 RNA QUANT-NO REFLEX-BLD
HIV 1 RNA Quant: NOT DETECTED {copies}/mL
HIV-1 RNA Quant, Log: NOT DETECTED {Log_copies}/mL

## 2023-05-17 ENCOUNTER — Encounter: Payer: Self-pay | Admitting: Infectious Disease

## 2023-05-17 ENCOUNTER — Ambulatory Visit: Payer: Medicaid Other | Admitting: Infectious Disease

## 2023-05-17 ENCOUNTER — Other Ambulatory Visit: Payer: Self-pay

## 2023-05-17 VITALS — BP 130/87 | HR 79 | Temp 97.8°F | Ht 72.0 in | Wt 179.0 lb

## 2023-05-17 DIAGNOSIS — F419 Anxiety disorder, unspecified: Secondary | ICD-10-CM | POA: Diagnosis not present

## 2023-05-17 DIAGNOSIS — R454 Irritability and anger: Secondary | ICD-10-CM

## 2023-05-17 DIAGNOSIS — E785 Hyperlipidemia, unspecified: Secondary | ICD-10-CM

## 2023-05-17 DIAGNOSIS — B20 Human immunodeficiency virus [HIV] disease: Secondary | ICD-10-CM

## 2023-05-17 DIAGNOSIS — Z23 Encounter for immunization: Secondary | ICD-10-CM | POA: Diagnosis not present

## 2023-05-17 DIAGNOSIS — F32A Depression, unspecified: Secondary | ICD-10-CM | POA: Diagnosis not present

## 2023-05-17 DIAGNOSIS — N529 Male erectile dysfunction, unspecified: Secondary | ICD-10-CM

## 2023-05-17 DIAGNOSIS — Z7185 Encounter for immunization safety counseling: Secondary | ICD-10-CM

## 2023-05-17 DIAGNOSIS — N1831 Chronic kidney disease, stage 3a: Secondary | ICD-10-CM

## 2023-05-17 DIAGNOSIS — I10 Essential (primary) hypertension: Secondary | ICD-10-CM

## 2023-05-17 MED ORDER — SILDENAFIL CITRATE 100 MG PO TABS: 100.0000 mg | ORAL_TABLET | Freq: Every day | ORAL | 11 refills | Status: AC | PRN

## 2023-05-17 MED ORDER — ATORVASTATIN CALCIUM 20 MG PO TABS: 20.0000 mg | ORAL_TABLET | Freq: Every day | ORAL | 11 refills | Status: AC

## 2023-05-17 MED ORDER — DOVATO 50-300 MG PO TABS: 1.0000 | ORAL_TABLET | Freq: Every day | ORAL | 11 refills | Status: AC
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
## 2023-05-18 ENCOUNTER — Ambulatory Visit (INDEPENDENT_AMBULATORY_CARE_PROVIDER_SITE_OTHER): Admitting: Student

## 2023-05-18 VITALS — BP 117/80 | HR 81 | Wt 175.4 lb

## 2023-05-18 DIAGNOSIS — L570 Actinic keratosis: Secondary | ICD-10-CM | POA: Diagnosis not present

## 2023-05-18 DIAGNOSIS — M79672 Pain in left foot: Secondary | ICD-10-CM

## 2023-05-18 NOTE — Progress Notes (Signed)
  SUBJECTIVE:   CHIEF COMPLAINT / HPI:   The patient presents with multiple skin lesions on the forehead and scalp, which he describes as scaling and have been present for 4-6 years. He has been managing the lesions with lotion application 4-5 times a day and occasional picking. He has a history of skin cancer on the ear, which was surgically removed. He admits to inconsistent use of sunscreen. He is unable to follow up with dermatology for financial reasons. 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  Additionally, the patient reports a painful lump on his left heel that has been present for 6-8 months. The pain is exacerbated by playing pickleball and walking, particularly after resting post-exercise. The pain is localized to the lump and does not radiate. He has not been icing the area or using any topical treatments due to uncertainty about the nature of the lump.  PERTINENT  PMH / PSH: HTN, HIV, HLD, CKD3a, skin cancer (unknown type)  OBJECTIVE:  BP 117/80   Pulse 81   Wt 175 lb 6.4 oz (79.6 kg)   SpO2 98%   BMI 23.79 kg/m  Skin: significant actinic damage and lentigines over face and neck; 9 areas of scaling, erythematous lesions c/w actinic keratoses on forehead and temporal head bilaterally   MSK: 2 x 3 cm hard, tender, nonmobile mass without erythema overlying achilles of left foot; ROM and strength normal in left foot   ASSESSMENT/PLAN:   Assessment & Plan Actinic keratosis Scaling and sun damage are concerning for actinic keratosis, which could progress to skin cancer if untreated. Does not consistently use sunscreen, increasing risk of further sun damage. Cryotherapy is proposed to treat the lesions. If lesions do not resolve after several rounds of cryotherapy, a biopsy will be considered. A chemotherapy cream (Imiquimod) is an alternative treatment, recommended for use in fall or winter due to increased photosensitivity risk. Would ultimately  benefit from dermatology given his history of prior skin cancer although this is currently not a financial option for him. - Perform cryotherapy on the affected areas today x 9 lesions. Use vaseline to aid healing, wound care discussed. - Advised to use sunscreen consistently to protect against further sun damage. - Schedule a follow-up appointment in 2-3 weeks to assess the response to cryotherapy. - Discussed the potential need for a biopsy if lesions do not resolve after several rounds of cryotherapy. - Educate about the use of a chemotherapy cream in the fall or winter if lesions persist, due to increased photosensitivity risk. Pain of left heel Differential diagnosis includes a bunion, although the location is atypical. Concerned for joint or bony involvement, would benefit from imaging.  - Referral placed to sports medicine, may need ultrasound or further imaging Return in about 3 weeks (around 06/08/2023) for Actinic damage follow-up.  Shelby Mattocks, DO 05/18/2023, 2:57 PM PGY-3, [facility]

## 2023-05-18 NOTE — Patient Instructions (Addendum)
 It was great to see you today! Thank you for choosing [facility] for your primary care.  Today we addressed: Actinic damage: We performed cryotherapy on 7 spots.  Please follow-up in 2 to 3 weeks for reevaluation and potential retreatment.  The spots may blister and cause irritation.  Use Vaseline several times a day and when you are outside use sunscreen.  I do recommend we use imiquimod cream on you which is a chemotherapeutic agent for skin damage but this can only be done in the late fall or winter time. I have placed a referral to sports therapy for you.  You have an appointment on Thursday for the pain in your heel.  If you haven't already, sign up for My Chart to have easy access to your labs results, and communication with your primary care physician.  Return in about 3 weeks (around 06/08/2023) for Actinic damage follow-up. Please arrive 15 minutes before your appointment to ensure smooth check in process.  We appreciate your efforts in making this happen.  Thank you for allowing me to participate in your care, Shelby Mattocks, DO 05/18/2023, 11:48 AM PGY-3, [facility]

## 2023-05-20 ENCOUNTER — Encounter: Payer: Self-pay | Admitting: Family Medicine

## 2023-05-20 ENCOUNTER — Ambulatory Visit: Admitting: Family Medicine

## 2023-05-20 ENCOUNTER — Other Ambulatory Visit: Payer: Self-pay

## 2023-05-20 VITALS — BP 138/88 | Ht 72.0 in | Wt 176.0 lb

## 2023-05-20 DIAGNOSIS — M9262 Juvenile osteochondrosis of tarsus, left ankle: Secondary | ICD-10-CM | POA: Diagnosis not present

## 2023-05-20 DIAGNOSIS — M7741 Metatarsalgia, right foot: Secondary | ICD-10-CM

## 2023-05-20 DIAGNOSIS — M9261 Juvenile osteochondrosis of tarsus, right ankle: Secondary | ICD-10-CM | POA: Diagnosis not present

## 2023-05-20 DIAGNOSIS — I7300 Raynaud's syndrome without gangrene: Secondary | ICD-10-CM | POA: Diagnosis not present
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
## 2023-06-05 DIAGNOSIS — Z419 Encounter for procedure for purposes other than remedying health state, unspecified: Secondary | ICD-10-CM | POA: Diagnosis not present

## 2023-06-07 ENCOUNTER — Encounter: Payer: Self-pay | Admitting: Student

## 2023-06-07 ENCOUNTER — Ambulatory Visit: Admitting: Student

## 2023-06-07 VITALS — BP 120/80 | HR 81 | Ht 72.0 in | Wt 173.5 lb

## 2023-06-07 DIAGNOSIS — L578 Other skin changes due to chronic exposure to nonionizing radiation: Secondary | ICD-10-CM | POA: Diagnosis not present

## 2023-06-07 DIAGNOSIS — F332 Major depressive disorder, recurrent severe without psychotic features: Secondary | ICD-10-CM

## 2023-06-07 NOTE — Progress Notes (Signed)
  SUBJECTIVE:   CHIEF COMPLAINT / HPI:   Actinic damage follow-up: Follow-up from 3/25 when she received cryotherapy for 9 separate lesions of actinic keratoses on face.  Use Vaseline on the spots prior and notes that they healed up very well.  He did not have any complaints but notes he still has a few scaly areas.  PERTINENT  PMH / PSH: HTN, HIV, HLD, CKD3a, skin cancer (unknown type)   OBJECTIVE:  BP 120/80   Pulse 81   Ht 6' (1.829 m)   Wt 173 lb 8 oz (78.7 kg)   SpO2 96%   BMI 23.53 kg/m  Skin: significant actinic damage and lentigines over face and neck; 4 areas of scaling, erythematous lesions c/w actinic keratoses on forehead and temporal head bilaterally  ASSESSMENT/PLAN:   Assessment & Plan Actinic skin damage Previous AK's responded well to cryotherapy, for more spots treated.  Discussed aftercare precautions appropriately with Vaseline use.  He may return as needed for any new actinic keratoses and recommend returning in late fall/early winter for trial of 5-fluorouracil. Severe episode of recurrent major depressive disorder, without psychotic features (HCC) PHQ-9 score of 18 with score 3 for question 9.  This has been discussed in the past.  He has been provided suicide hotline resources in the past as well.  Called after appointment to check in with patient and left HIPAA compliant voice message.  Veronia Goon, DO 06/07/2023, 12:13 PM PGY-3, [facility]

## 2023-06-07 NOTE — Assessment & Plan Note (Signed)
 Previous AK's responded well to cryotherapy, for more spots treated.  Discussed aftercare precautions appropriately with Vaseline use.  He may return as needed for any new actinic keratoses and recommend returning in late fall/early winter for trial of 5-fluorouracil.

## 2023-06-07 NOTE — Assessment & Plan Note (Signed)
 PHQ-9 score of 18 with score 3 for question 9.  This has been discussed in the past.  He has been provided suicide hotline resources in the past as well.  Called after appointment to check in with patient and left HIPAA compliant voice message.

## 2023-07-05 DIAGNOSIS — Z419 Encounter for procedure for purposes other than remedying health state, unspecified: Secondary | ICD-10-CM | POA: Diagnosis not present

## 2023-07-13 ENCOUNTER — Encounter: Payer: Self-pay | Admitting: Student

## 2023-07-13 ENCOUNTER — Ambulatory Visit (INDEPENDENT_AMBULATORY_CARE_PROVIDER_SITE_OTHER): Admitting: Student

## 2023-07-13 VITALS — BP 140/87 | HR 98 | Temp 97.9°F | Wt 175.8 lb

## 2023-07-13 DIAGNOSIS — F332 Major depressive disorder, recurrent severe without psychotic features: Secondary | ICD-10-CM | POA: Diagnosis not present

## 2023-07-13 DIAGNOSIS — B349 Viral infection, unspecified: Secondary | ICD-10-CM

## 2023-07-13 NOTE — Progress Notes (Signed)
  SUBJECTIVE:   CHIEF COMPLAINT / HPI:   Jonathan Hendricks is a 63 year old male who presents with a cough and respiratory symptoms.  He developed respiratory symptoms four days ago, including a rattling sound in his chest during sleep, difficulty breathing, and a productive cough with thick mucus. Mucus is also present in his nasal passages. He experiences night sweats similar to a fever, though he has not measured his temperature. He has a sore throat from frequent coughing, approximately eight times a day, and a decreased appetite.  He recently visited a friend's house where the friend had been sick three weeks prior and was on antibiotics after visiting urgent care. He has been using over-the-counter medications such as Theraflu and Mucinex without relief. He has not used Tylenol , Vicks VapoRub, or Flonase.  His current medications include atorvastatin , a combination blood pressure medication, a combination HIV medication, and valacyclovir  for genital herpes.  PERTINENT  PMH / PSH: HTN, HIV, HLD, CKD3a, skin cancer (unknown type)   OBJECTIVE:  BP (!) 140/87   Pulse 98   Temp 97.9 F (36.6 C) (Oral)   Wt 175 lb 12.8 oz (79.7 kg)   SpO2 98%   BMI 23.84 kg/m  General: NAD HEENT: Clear TMs, erythematous nasopharynx without tonsillar abnormality CV: RRR, no murmurs appreciable Pulm: CTAB, normal WOB  ASSESSMENT/PLAN:   Assessment & Plan Viral illness History and physical exam favors upper respiratory virus, recommend conservative management.  Will check in with patient on Thursday for whether this has improved.  Advised chest x-ray if symptoms persist into Friday. Severe episode of recurrent major depressive disorder, without psychotic features (HCC) Patient does not favor medications at this time, states suicidal thoughts are chronic.  He has a counselor support through his infectious disease doctor.  Provided hotline resources, recommend reaching out should he want to discuss  further.  Veronia Goon, DO 07/13/2023, 3:29 PM PGY-3, [facility]

## 2023-07-13 NOTE — Assessment & Plan Note (Signed)
 Patient does not favor medications at this time, states suicidal thoughts are chronic.  He has a counselor support through his infectious disease doctor.  Provided hotline resources, recommend reaching out should he want to discuss further.

## 2023-07-13 NOTE — Patient Instructions (Addendum)
 It was so good to see you today!   This is most likely a viral infection. This will take time to get over. The treatment for this is supportive care. You can alternate Tylenol  and Ibuprofen for pain or fever every 3 hours (there should be 6 hours in between each dose of Tylenol , and 6 hours in between doses of Ibuprofen). You can give a teaspoon of honey by itself or mixed with water to help cough. Steam baths, Vicks vapor rub, a humidifier and nasal saline spray can help with congestion.   It is important to keep hydrated throughout this time!  Frequent hand washing to prevent recurrent illnesses is important.   Should this not improve in the next couple days, I recommend chest xray.  I have placed an order for chest xray.  Please go to [facility] to have this completed.  You do not need an appointment, but if you would like to call them beforehand, their number is [phone].  We will contact you with your results afterwards.   Social Support program [facility] [place] or [employer] [address], [place], [place] Recovery support and educational   24- Hour Availability:   [facility]  [address] [place], [place] [phone] Crisis [phone]  [facility] [phone]  [place] [facility]  [phone]   [facility]  [phone] (after hours)  [facility]/Mobile Crisis   [phone]  [place]  National Suicide Hotline  850-234-3665 Derrel Flies)  Call 911 or go to emergency room  [facility]  [phone]);  [place] and [employer]  [phone]); [place], [place], [place], [place], [place], [place], [place]

## 2023-07-15 ENCOUNTER — Ambulatory Visit: Payer: Self-pay | Admitting: Student

## 2023-07-15 LAB — COVID-19, FLU A+B AND RSV
Influenza A, NAA: NOT DETECTED
Influenza B, NAA: NOT DETECTED
RSV, NAA: DETECTED — AB
SARS-CoV-2, NAA: NOT DETECTED

## 2023-07-15 NOTE — Telephone Encounter (Signed)
 Called regarding viral testing, straight to VM with patient's name identified. Notified patient that he is positive for RSV therefore would not recommend antibiotics at this time. Self-limiting virus which should improve with time.

## 2023-07-23 NOTE — Progress Notes (Signed)
 The 10-year ASCVD risk score (Arnett DK, et al., 2019) is: 12.5%   Values used to calculate the score:     Age: 63 years     Sex: Male     Is Non-Hispanic African American: No     Diabetic: No     Tobacco smoker: No     Systolic Blood Pressure: 140 mmHg     Is BP treated: Yes     HDL Cholesterol: 39 mg/dL     Total Cholesterol: 147 mg/dL  Currently prescribed atorvastatin  20 mg.  Chloeann Alfred, BSN, RN

## 2023-08-03 ENCOUNTER — Encounter: Payer: Self-pay | Admitting: *Deleted

## 2023-08-05 DIAGNOSIS — Z419 Encounter for procedure for purposes other than remedying health state, unspecified: Secondary | ICD-10-CM | POA: Diagnosis not present

## 2023-09-04 DIAGNOSIS — Z419 Encounter for procedure for purposes other than remedying health state, unspecified: Secondary | ICD-10-CM | POA: Diagnosis not present

## 2023-09-07 ENCOUNTER — Other Ambulatory Visit: Payer: Self-pay | Admitting: Infectious Disease

## 2023-10-05 DIAGNOSIS — Z419 Encounter for procedure for purposes other than remedying health state, unspecified: Secondary | ICD-10-CM | POA: Diagnosis not present

## 2023-10-27 ENCOUNTER — Ambulatory Visit

## 2023-10-27 ENCOUNTER — Other Ambulatory Visit: Payer: Self-pay

## 2023-11-05 DIAGNOSIS — Z419 Encounter for procedure for purposes other than remedying health state, unspecified: Secondary | ICD-10-CM | POA: Diagnosis not present

## 2023-11-17 ENCOUNTER — Other Ambulatory Visit: Payer: Self-pay

## 2023-11-17 ENCOUNTER — Other Ambulatory Visit (HOSPITAL_COMMUNITY)
Admission: RE | Admit: 2023-11-17 | Discharge: 2023-11-17 | Disposition: A | Discharge status: Home / Self Care | Source: Ambulatory Visit | Attending: Infectious Disease | Admitting: Infectious Disease

## 2023-11-17 ENCOUNTER — Other Ambulatory Visit

## 2023-11-17 DIAGNOSIS — N529 Male erectile dysfunction, unspecified: Secondary | ICD-10-CM | POA: Diagnosis not present

## 2023-11-17 DIAGNOSIS — B20 Human immunodeficiency virus [HIV] disease: Secondary | ICD-10-CM

## 2023-11-18 LAB — T-HELPER CELL (CD4) - (RCID CLINIC ONLY)
CD4 % Helper T Cell: 38 % (ref 33–65)
CD4 T Cell Abs: 974 /uL (ref 400–1790)

## 2023-11-18 LAB — URINE CYTOLOGY ANCILLARY ONLY
Chlamydia: NEGATIVE
Comment: NEGATIVE
Comment: NORMAL
Neisseria Gonorrhea: NEGATIVE

## 2023-11-19 LAB — CBC WITH DIFFERENTIAL/PLATELET
Absolute Lymphocytes: 2552 {cells}/uL (ref 850–3900)
Absolute Monocytes: 460 {cells}/uL (ref 200–950)
Basophils Absolute: 19 {cells}/uL (ref 0–200)
Basophils Relative: 0.3 %
Eosinophils Absolute: 176 {cells}/uL (ref 15–500)
Eosinophils Relative: 2.8 %
HCT: 44.1 % (ref 38.5–50.0)
Hemoglobin: 15.1 g/dL (ref 13.2–17.1)
MCH: 33.9 pg — ABNORMAL HIGH (ref 27.0–33.0)
MCHC: 34.2 g/dL (ref 32.0–36.0)
MCV: 98.9 fL (ref 80.0–100.0)
MPV: 10.3 fL (ref 7.5–12.5)
Monocytes Relative: 7.3 %
Neutro Abs: 3093 {cells}/uL (ref 1500–7800)
Neutrophils Relative %: 49.1 %
Platelets: 292 Thousand/uL (ref 140–400)
RBC: 4.46 Million/uL (ref 4.20–5.80)
RDW: 13.1 % (ref 11.0–15.0)
Total Lymphocyte: 40.5 %
WBC: 6.3 Thousand/uL (ref 3.8–10.8)

## 2023-11-19 LAB — COMPLETE METABOLIC PANEL WITHOUT GFR
AG Ratio: 1.3 (calc) (ref 1.0–2.5)
ALT: 18 U/L (ref 9–46)
AST: 22 U/L (ref 10–35)
Albumin: 4.1 g/dL (ref 3.6–5.1)
Alkaline phosphatase (APISO): 76 U/L (ref 35–144)
BUN/Creatinine Ratio: 9 (calc) (ref 6–22)
BUN: 13 mg/dL (ref 7–25)
CO2: 32 mmol/L (ref 20–32)
Calcium: 9.2 mg/dL (ref 8.6–10.3)
Chloride: 103 mmol/L (ref 98–110)
Creat: 1.37 mg/dL — ABNORMAL HIGH (ref 0.70–1.35)
Globulin: 3.2 g/dL (ref 1.9–3.7)
Glucose, Bld: 65 mg/dL (ref 65–99)
Potassium: 4.6 mmol/L (ref 3.5–5.3)
Sodium: 143 mmol/L (ref 135–146)
Total Bilirubin: 0.7 mg/dL (ref 0.2–1.2)
Total Protein: 7.3 g/dL (ref 6.1–8.1)

## 2023-11-19 LAB — LIPID PANEL
Cholesterol: 158 mg/dL (ref <200)
HDL: 46 mg/dL (ref >=40)
LDL Cholesterol (Calc): 81 mg/dL
Non-HDL Cholesterol (Calc): 112 mg/dL (ref <130)
Total CHOL/HDL Ratio: 3.4 (calc) (ref <5.0)
Triglycerides: 214 mg/dL — ABNORMAL HIGH (ref <150)

## 2023-11-19 LAB — HIV-1 RNA QUANT-NO REFLEX-BLD
HIV 1 RNA Quant: NOT DETECTED {copies}/mL
HIV-1 RNA Quant, Log: NOT DETECTED {Log_copies}/mL

## 2023-11-19 LAB — T PALLIDUM AB: T Pallidum Abs: POSITIVE — AB

## 2023-11-19 LAB — HEPATITIS C ANTIBODY: Hepatitis C Ab: NONREACTIVE

## 2023-11-19 LAB — RPR TITER: RPR Titer: 1:1 {titer} — ABNORMAL HIGH

## 2023-11-19 LAB — RPR: RPR Ser Ql: REACTIVE — AB

## 2023-12-01 ENCOUNTER — Other Ambulatory Visit (HOSPITAL_COMMUNITY)
Admission: RE | Admit: 2023-12-01 | Discharge: 2023-12-01 | Disposition: A | Discharge status: Home / Self Care | Source: Ambulatory Visit | Attending: Infectious Disease | Admitting: Infectious Disease

## 2023-12-01 ENCOUNTER — Encounter: Payer: Self-pay | Admitting: Infectious Disease

## 2023-12-01 ENCOUNTER — Ambulatory Visit (INDEPENDENT_AMBULATORY_CARE_PROVIDER_SITE_OTHER): Payer: Self-pay | Admitting: Infectious Disease

## 2023-12-01 ENCOUNTER — Other Ambulatory Visit: Payer: Self-pay

## 2023-12-01 VITALS — Ht 72.0 in | Wt 175.0 lb

## 2023-12-01 DIAGNOSIS — Z113 Encounter for screening for infections with a predominantly sexual mode of transmission: Secondary | ICD-10-CM

## 2023-12-01 DIAGNOSIS — Z7185 Encounter for immunization safety counseling: Secondary | ICD-10-CM | POA: Diagnosis not present

## 2023-12-01 DIAGNOSIS — F331 Major depressive disorder, recurrent, moderate: Secondary | ICD-10-CM | POA: Insufficient documentation

## 2023-12-01 DIAGNOSIS — E785 Hyperlipidemia, unspecified: Secondary | ICD-10-CM

## 2023-12-01 DIAGNOSIS — B20 Human immunodeficiency virus [HIV] disease: Secondary | ICD-10-CM | POA: Diagnosis not present

## 2023-12-01 DIAGNOSIS — I10 Essential (primary) hypertension: Secondary | ICD-10-CM

## 2023-12-01 DIAGNOSIS — A609 Anogenital herpesviral infection, unspecified: Secondary | ICD-10-CM | POA: Diagnosis not present

## 2023-12-01 DIAGNOSIS — R45851 Suicidal ideations: Secondary | ICD-10-CM | POA: Insufficient documentation
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
## 2023-12-02 LAB — URINE CYTOLOGY ANCILLARY ONLY
Chlamydia: NEGATIVE
Comment: NEGATIVE
Comment: NORMAL
Neisseria Gonorrhea: NEGATIVE

## 2023-12-02 LAB — CYTOLOGY, (ORAL, ANAL, URETHRAL) ANCILLARY ONLY
Chlamydia: NEGATIVE
Chlamydia: NEGATIVE
Comment: NEGATIVE
Comment: NEGATIVE
Comment: NORMAL
Comment: NORMAL
Neisseria Gonorrhea: NEGATIVE
Neisseria Gonorrhea: NEGATIVE

## 2023-12-05 DIAGNOSIS — Z419 Encounter for procedure for purposes other than remedying health state, unspecified: Secondary | ICD-10-CM | POA: Diagnosis not present
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
## 2024-02-07 ENCOUNTER — Ambulatory Visit: Payer: Self-pay | Admitting: Infectious Disease

## 2024-02-07 DIAGNOSIS — N1831 Chronic kidney disease, stage 3a: Secondary | ICD-10-CM

## 2024-02-07 DIAGNOSIS — B20 Human immunodeficiency virus [HIV] disease: Secondary | ICD-10-CM

## 2024-02-07 DIAGNOSIS — Z7185 Encounter for immunization safety counseling: Secondary | ICD-10-CM

## 2024-02-07 DIAGNOSIS — Z23 Encounter for immunization: Secondary | ICD-10-CM

## 2024-02-10 ENCOUNTER — Ambulatory Visit: Admitting: Family Medicine

## 2024-02-10 VITALS — BP 145/90 | HR 86 | Ht 72.0 in | Wt 180.6 lb

## 2024-02-10 DIAGNOSIS — L570 Actinic keratosis: Secondary | ICD-10-CM

## 2024-02-10 MED ORDER — FLUOROURACIL 5 % EX CREA: TOPICAL_CREAM | Freq: Two times a day (BID) | CUTANEOUS | 1 refills | Status: AC

## 2024-02-10 NOTE — Progress Notes (Signed)
° ° °  SUBJECTIVE:   CHIEF COMPLAINT / HPI: f/u skin cancer    Jonathan Hendricks presents for a follow-up on his skin cancer. It was previously frozen and he was told to follow up in the winter time for some cream. He states he forgot last year but was reminded this year. He spends time outside playing pickle ball and states that he does use sunscreen.   PERTINENT  PMH / PSH: Actinic skin damage   OBJECTIVE:   BP (!) 145/90   Pulse 86   Ht 6' (1.829 m)   Wt 180 lb 9.6 oz (81.9 kg)   SpO2 98%   BMI 24.49 kg/m   General: NAD Respiratory: breathing comfortably on room air Psych: mood and affect appropriate Neuro: no gross focal deficits  Skin: few 1-2 mm hyperkeratotic skin lesions on lateral aspect of forehead bilaterally, multiple flat hyperpigmented 1-2 mm brown lesions with irregular borders also on lateral forehead bilaterally       ASSESSMENT/PLAN:   Assessment & Plan AK (actinic keratosis) - prescribed Fluorouracil  5% cream BID for 4 weeks  - patient advised to follow-up in 1 month      Raguel KANDICE Lee, DO [facility]

## 2024-02-10 NOTE — Patient Instructions (Signed)
 It was good to see you today. Please use the cream that we sent you 2 times a day for 4 weeks and then follow-up in clinic so we can re-check it.

## 2024-04-03 ENCOUNTER — Ambulatory Visit: Payer: Self-pay
